# Patient Record
Sex: Female | Born: 1975 | Race: Black or African American | Hispanic: No | Marital: Married | Smoking: Never smoker
Health system: Southern US, Community
[De-identification: ages and names within clinical notes are randomized; demographics above are authoritative.]

## PROBLEM LIST (undated history)

## (undated) ENCOUNTER — Inpatient Hospital Stay (HOSPITAL_COMMUNITY): Payer: Self-pay

## (undated) DIAGNOSIS — B009 Herpesviral infection, unspecified: Secondary | ICD-10-CM

## (undated) DIAGNOSIS — R87610 Atypical squamous cells of undetermined significance on cytologic smear of cervix (ASC-US): Principal | ICD-10-CM

## (undated) HISTORY — PX: WISDOM TOOTH EXTRACTION: SHX21

## (undated) HISTORY — DX: Atypical squamous cells of undetermined significance on cytologic smear of cervix (ASC-US): R87.610

---

## 2006-01-27 ENCOUNTER — Emergency Department (HOSPITAL_COMMUNITY): Admission: EM | Admit: 2006-01-27 | Discharge: 2006-01-27 | Payer: Self-pay | Admitting: Emergency Medicine

## 2012-08-16 ENCOUNTER — Encounter: Payer: Self-pay | Admitting: Obstetrics & Gynecology

## 2012-08-16 ENCOUNTER — Ambulatory Visit (INDEPENDENT_AMBULATORY_CARE_PROVIDER_SITE_OTHER): Payer: BC Managed Care – PPO | Admitting: Obstetrics & Gynecology

## 2012-08-16 VITALS — BP 126/70 | Ht 64.0 in | Wt 189.0 lb

## 2012-08-16 DIAGNOSIS — Z3200 Encounter for pregnancy test, result unknown: Secondary | ICD-10-CM

## 2012-08-16 DIAGNOSIS — Z3201 Encounter for pregnancy test, result positive: Secondary | ICD-10-CM

## 2012-08-16 LAB — POCT URINE PREGNANCY: Preg Test, Ur: POSITIVE

## 2012-08-18 ENCOUNTER — Other Ambulatory Visit: Payer: Self-pay | Admitting: Obstetrics & Gynecology

## 2012-08-18 DIAGNOSIS — O3680X0 Pregnancy with inconclusive fetal viability, not applicable or unspecified: Secondary | ICD-10-CM

## 2012-08-23 ENCOUNTER — Ambulatory Visit (INDEPENDENT_AMBULATORY_CARE_PROVIDER_SITE_OTHER): Payer: BC Managed Care – PPO

## 2012-08-23 ENCOUNTER — Telehealth: Payer: Self-pay | Admitting: *Deleted

## 2012-08-23 DIAGNOSIS — O3680X0 Pregnancy with inconclusive fetal viability, not applicable or unspecified: Secondary | ICD-10-CM

## 2012-08-23 MED ORDER — ONDANSETRON HCL 8 MG PO TABS: 8.0000 mg | ORAL_TABLET | Freq: Two times a day (BID) | ORAL | Status: DC | PRN

## 2012-08-23 NOTE — Telephone Encounter (Signed)
Pt here today for ultrasound, requesting medication for N/V. Can you e-scribe medicaiton?

## 2012-08-23 NOTE — Telephone Encounter (Signed)
Pt complains of nausea will rx zofran 8 mg 1 every 12 hours prn #20 with 3 refills at [facility]

## 2012-08-31 ENCOUNTER — Encounter: Payer: Self-pay | Admitting: Women's Health

## 2012-08-31 ENCOUNTER — Ambulatory Visit (INDEPENDENT_AMBULATORY_CARE_PROVIDER_SITE_OTHER): Payer: BC Managed Care – PPO | Admitting: Women's Health

## 2012-08-31 VITALS — BP 110/66 | Wt 189.6 lb

## 2012-08-31 DIAGNOSIS — O0991 Supervision of high risk pregnancy, unspecified, first trimester: Secondary | ICD-10-CM

## 2012-08-31 DIAGNOSIS — Z1389 Encounter for screening for other disorder: Secondary | ICD-10-CM

## 2012-08-31 DIAGNOSIS — Z331 Pregnant state, incidental: Secondary | ICD-10-CM

## 2012-08-31 DIAGNOSIS — O99019 Anemia complicating pregnancy, unspecified trimester: Secondary | ICD-10-CM

## 2012-08-31 DIAGNOSIS — O219 Vomiting of pregnancy, unspecified: Secondary | ICD-10-CM

## 2012-08-31 DIAGNOSIS — O09521 Supervision of elderly multigravida, first trimester: Secondary | ICD-10-CM

## 2012-08-31 DIAGNOSIS — O09899 Supervision of other high risk pregnancies, unspecified trimester: Secondary | ICD-10-CM

## 2012-08-31 DIAGNOSIS — O09219 Supervision of pregnancy with history of pre-term labor, unspecified trimester: Secondary | ICD-10-CM

## 2012-08-31 DIAGNOSIS — O09529 Supervision of elderly multigravida, unspecified trimester: Secondary | ICD-10-CM | POA: Insufficient documentation

## 2012-08-31 LAB — RPR

## 2012-08-31 LAB — CBC
HCT: 34.8 % — ABNORMAL LOW (ref 36.0–46.0)
Hemoglobin: 11.2 g/dL — ABNORMAL LOW (ref 12.0–15.0)
MCH: 27.1 pg (ref 26.0–34.0)
MCHC: 32.2 g/dL (ref 30.0–36.0)
MCV: 84.3 fL (ref 78.0–100.0)
Platelets: 378 10*3/uL (ref 150–400)
RBC: 4.13 MIL/uL (ref 3.87–5.11)
RDW: 14 % (ref 11.5–15.5)
WBC: 6.9 10*3/uL (ref 4.0–10.5)

## 2012-08-31 LAB — POCT URINALYSIS DIPSTICK
Glucose, UA: NEGATIVE
Ketones, UA: NEGATIVE
Nitrite, UA: NEGATIVE
Protein, UA: NEGATIVE

## 2012-08-31 MED ORDER — DOXYLAMINE-PYRIDOXINE 10-10 MG PO TBEC: 10.0000 mg | DELAYED_RELEASE_TABLET | ORAL | Status: DC

## 2012-08-31 NOTE — Progress Notes (Signed)
  Subjective:    Theresa Farrell is a 37 y.o. [id number] African American female at [redacted]w[redacted]d by 9 wk u/s, being seen today for her first obstetrical visit.  Her obstetrical history is significant for advanced maternal age and term svd x 2, w/ largest baby weight 9+lbs w/o complications.  Pregnancy history fully reviewed. FOB is 37yo.   Patient reports feeling bloated, zofran helping some but still nauseated, constipation. Denies vb, cramping, urinary frequency, hesitancy, urgency, or dysuria.   Filed Vitals:   08/31/12 0959  BP: 110/66  Weight: 189 lb 9.6 oz (86.002 kg)    HISTORY: OB History   Grav Para Term Preterm Abortions TAB SAB Ect Mult Living   3 2 1 1      2      # Outc Date GA Lbr Len/2nd Wgt Sex Del Anes PTL Lv   1 PRE 2003 [redacted]w[redacted]d  9lb6oz(4.252kg) F SVD   Yes   2 TRM 2004 [redacted]w[redacted]d  8lb(3.629kg) M SVD   Yes   3 CUR              Past Medical History  Diagnosis Date  . Medical history non-contributory    Past Surgical History  Procedure Laterality Date  . No past surgeries     Family History  Problem Relation Age of Onset  . Cancer Mother 31    breast  . Hypertension Mother      System:     Skin: normal coloration and turgor, no rashes    Neurologic: oriented, normal mood   Extremities: normal strength, tone, and muscle mass   HEENT PERRLA   Mouth/Teeth mucous membranes moist   Cardiovascular: regular rate and rhythm   Respiratory:  appears well, vitals normal, no respiratory distress, acyanotic, normal RR   Abdomen: soft, non-tender    Thin prep pap smear not obtained, pt had normal pap in 02/2012   FHR 150 via informal transabdominal u/s   Assessment:    Pregnancy: [id number] Patient Active Problem List   Diagnosis Date Noted  . Supervision of high-risk pregnancy of elderly multigravida (>= 79 years old at time of delivery) 08/31/2012    Priority: High      [redacted]w[redacted]d [id number] New OB visit AMA  N/V of pregnancy Constipation   Plan:     Initial labs  drawn Continue prenatal vitamins Problem list reviewed and updated Obtain pap results from 02/2012 Stop Zofran, Rx Diclegis #100 w/ 4 RF, 2 samples given Reviewed n/v and constipation relief measures and warning s/s to report Reviewed recommended weight gain based on pre-gravid BMI Encouraged well-balanced diet Genetic Screening discussed Integrated Screen: requested Cystic fibrosis screening discussed requested Ultrasound discussed; fetal survey: requested Follow up in 2 weeks for 1st NT/IT and visit  Marge Duncans 08/31/2012 10:26 AM

## 2012-08-31 NOTE — Patient Instructions (Signed)
Nausea & Vomiting  Have saltine crackers or pretzels by your bed and eat a few bites before you raise your head out of bed in the morning  Eat small frequent meals throughout the day instead of large meals  Drink plenty of fluids throughout the day to stay hydrated, just don't drink a lot of fluids with your meals.  This can make your stomach fill up faster making you feel sick  Do not brush your teeth right after you eat  Products with real ginger are good for nausea, like ginger ale and ginger hard candy Make sure it says made with real ginger!  Sucking on sour candy like lemon heads is also good for nausea  If your prenatal vitamins make you nauseated, take them at night so you will sleep through the nausea  If you feel like you need medicine for the nausea & vomiting please let us know  If you are unable to keep any fluids or food down please let us know   Constipation  Drink plenty of fluid, preferably water, throughout the day  Eat foods high in fiber such as fruits, vegetables, and grains  Exercise, such as walking, is a good way to keep your bowels regular  Drink warm fluids, especially warm prune juice, or decaf coffee  Eat a 1/2 cup of real oatmeal (not instant), 1/2 cup applesauce, and 1/2-1 cup warm prune juice every day  If needed, you may take Colace (docusate sodium) stool softener once or twice a day to help keep the stool soft. If you are pregnant, wait until you are out of your first trimester (12-14 weeks of pregnancy)  If you still are having problems with constipation, you may take Miralax once daily as needed to help keep your bowels regular.  If you are pregnant, wait until you are out of your first trimester (12-14 weeks of pregnancy)    Pregnancy - First Trimester During sexual intercourse, millions of sperm go into the vagina. Only 1 sperm will penetrate and fertilize the female egg while it is in the Fallopian tube. One week later, the fertilized egg  implants into the wall of the uterus. An embryo begins to develop into a baby. At 6 to 8 weeks, the eyes and face are formed and the heartbeat can be seen on ultrasound. At the end of 12 weeks (first trimester), all the baby's organs are formed. Now that you are pregnant, you will want to do everything you can to have a healthy baby. Two of the most important things are to get good prenatal care and follow your caregiver's instructions. Prenatal care is all the medical care you receive before the baby's birth. It is given to prevent, find, and treat problems during the pregnancy and childbirth. PRENATAL EXAMS  During prenatal visits, your weight, blood pressure, and urine are checked. This is done to make sure you are healthy and progressing normally during the pregnancy.  A pregnant woman should gain 25 to 35 pounds during the pregnancy. However, if you are overweight or underweight, your caregiver will advise you regarding your weight.  Your caregiver will ask and answer questions for you.  Blood work, cervical cultures, other necessary tests, and a Pap test are done during your prenatal exams. These tests are done to check on your health and the probable health of your baby. Tests are strongly recommended and done for HIV with your permission. This is the virus that causes AIDS. These tests are done because medicines   can be given to help prevent your baby from being born with this infection should you have been infected without knowing it. Blood work is also used to find out your blood type, previous infections, and follow your blood levels (hemoglobin).  Low hemoglobin (anemia) is common during pregnancy. Iron and vitamins are given to help prevent this. Later in the pregnancy, blood tests for diabetes will be done along with any other tests if any problems develop.  You may need other tests to make sure you and the baby are doing well. CHANGES DURING THE FIRST TRIMESTER  Your body goes through  many changes during pregnancy. They vary from person to person. Talk to your caregiver about changes you notice and are concerned about. Changes can include:  Your menstrual period stops.  The egg and sperm carry the genes that determine what you look like. Genes from you and your partner are forming a baby. The female genes determine whether the baby is a boy or a girl.  Your body increases in girth and you may feel bloated.  Feeling sick to your stomach (nauseous) and throwing up (vomiting). If the vomiting is uncontrollable, call your caregiver.  Your breasts will begin to enlarge and become tender.  Your nipples may stick out more and become darker.  The need to urinate more. Painful urination may mean you have a bladder infection.  Tiring easily.  Loss of appetite.  Cravings for certain kinds of food.  At first, you may gain or lose a couple of pounds.  You may have changes in your emotions from day to day (excited to be pregnant or concerned something may go wrong with the pregnancy and baby).  You may have more vivid and strange dreams. HOME CARE INSTRUCTIONS   It is very important to avoid all smoking, alcohol and non-prescribed drugs during your pregnancy. These affect the formation and growth of the baby. Avoid chemicals while pregnant to ensure the delivery of a healthy infant.  Start your prenatal visits by the 12th week of pregnancy. They are usually scheduled monthly at first, then more often in the last 2 months before delivery. Keep your caregiver's appointments. Follow your caregiver's instructions regarding medicine use, blood and lab tests, exercise, and diet.  During pregnancy, you are providing food for you and your baby. Eat regular, well-balanced meals. Choose foods such as meat, fish, milk and other low fat dairy products, vegetables, fruits, and whole-grain breads and cereals. Your caregiver will tell you of the ideal weight gain.  You can help morning  sickness by keeping soda crackers at the bedside. Eat a couple before arising in the morning. You may want to use the crackers without salt on them.  Eating 4 to 5 small meals rather than 3 large meals a day also may help the nausea and vomiting.  Drinking liquids between meals instead of during meals also seems to help nausea and vomiting.  A physical sexual relationship may be continued throughout pregnancy if there are no other problems. Problems may be early (premature) leaking of amniotic fluid from the membranes, vaginal bleeding, or belly (abdominal) pain.  Exercise regularly if there are no restrictions. Check with your caregiver or physical therapist if you are unsure of the safety of some of your exercises. Greater weight gain will occur in the last 2 trimesters of pregnancy. Exercising will help:  Control your weight.  Keep you in shape.  Prepare you for labor and delivery.  Help you lose your pregnancy   weight after you deliver your baby.  Wear a good support or jogging bra for breast tenderness during pregnancy. This may help if worn during sleep too.  Ask when prenatal classes are available. Begin classes when they are offered.  Do not use hot tubs, steam rooms, or saunas.  Wear your seat belt when driving. This protects you and your baby if you are in an accident.  Avoid raw meat, uncooked cheese, cat litter boxes, and soil used by cats throughout the pregnancy. These carry germs that can cause birth defects in the baby.  The first trimester is a good time to visit your dentist for your dental health. Getting your teeth cleaned is okay. Use a softer toothbrush and brush gently during pregnancy.  Ask for help if you have financial, counseling, or nutritional needs during pregnancy. Your caregiver will be able to offer counseling for these needs as well as refer you for other special needs.  Do not take any medicines or herbs unless told by your caregiver.  Inform your  caregiver if there is any mental or physical domestic violence.  Make a list of emergency phone numbers of family, friends, hospital, and police and fire departments.  Write down your questions. Take them to your prenatal visit.  Do not douche.  Do not cross your legs.  If you have to stand for long periods of time, rotate you feet or take small steps in a circle.  You may have more vaginal secretions that may require a sanitary pad. Do not use tampons or scented sanitary pads. MEDICINES AND DRUG USE IN PREGNANCY  Take prenatal vitamins as directed. The vitamin should contain 1 milligram of folic acid. Keep all vitamins out of reach of children. Only a couple vitamins or tablets containing iron may be fatal to a baby or young child when ingested.  Avoid use of all medicines, including herbs, over-the-counter medicines, not prescribed or suggested by your caregiver. Only take over-the-counter or prescription medicines for pain, discomfort, or fever as directed by your caregiver. Do not use aspirin, ibuprofen, or naproxen unless directed by your caregiver.  Let your caregiver also know about herbs you may be using.  Alcohol is related to a number of birth defects. This includes fetal alcohol syndrome. All alcohol, in any form, should be avoided completely. Smoking will cause low birth rate and premature babies.  Street or illegal drugs are very harmful to the baby. They are absolutely forbidden. A baby born to an addicted mother will be addicted at birth. The baby will go through the same withdrawal an adult does.  Let your caregiver know about any medicines that you have to take and for what reason you take them. SEEK MEDICAL CARE IF:  You have any concerns or worries during your pregnancy. It is better to call with your questions if you feel they cannot wait, rather than worry about them. SEEK IMMEDIATE MEDICAL CARE IF:   An unexplained oral temperature above 102 F (38.9 C) develops,  or as your caregiver suggests.  You have leaking of fluid from the vagina (birth canal). If leaking membranes are suspected, take your temperature and inform your caregiver of this when you call.  There is vaginal spotting or bleeding. Notify your caregiver of the amount and how many pads are used.  You develop a bad smelling vaginal discharge with a change in the color.  You continue to feel sick to your stomach (nauseated) and have no relief from remedies suggested. You   vomit blood or coffee ground-like materials.  You lose more than 2 pounds of weight in 1 week.  You gain more than 2 pounds of weight in 1 week and you notice swelling of your face, hands, feet, or legs.  You gain 5 pounds or more in 1 week (even if you do not have swelling of your hands, face, legs, or feet).  You get exposed to German measles and have never had them.  You are exposed to fifth disease or chickenpox.  You develop belly (abdominal) pain. Round ligament discomfort is a common non-cancerous (benign) cause of abdominal pain in pregnancy. Your caregiver still must evaluate this.  You develop headache, fever, diarrhea, pain with urination, or shortness of breath.  You fall or are in a car accident or have any kind of trauma.  There is mental or physical violence in your home. Document Released: 02/25/2001 Document Revised: 11/26/2011 Document Reviewed: 08/29/2008 [employer] Patient Information 2014 [employer].  

## 2012-08-31 NOTE — Progress Notes (Signed)
Pt is bloated. Feels like belly is more swollen than it should be. Cramping in left side. New OB packet given. Consents signed. Pt to sign a release for pap smear. Had a pap smear in December 2013.

## 2012-09-01 LAB — URINALYSIS
Bilirubin Urine: NEGATIVE
Glucose, UA: NEGATIVE mg/dL
Hgb urine dipstick: NEGATIVE
Ketones, ur: NEGATIVE mg/dL
Leukocytes, UA: NEGATIVE
Nitrite: NEGATIVE
Protein, ur: NEGATIVE mg/dL
Specific Gravity, Urine: 1.028 (ref 1.005–1.030)
Urobilinogen, UA: 1 mg/dL (ref 0.0–1.0)
pH: 7 (ref 5.0–8.0)

## 2012-09-01 LAB — DRUG SCREEN, URINE, NO CONFIRMATION
Amphetamine Screen, Ur: NEGATIVE
Barbiturate Quant, Ur: NEGATIVE
Benzodiazepines.: NEGATIVE
Cocaine Metabolites: NEGATIVE
Creatinine,U: 197.4 mg/dL
Marijuana Metabolite: NEGATIVE
Methadone: NEGATIVE
Opiate Screen, Urine: NEGATIVE
Phencyclidine (PCP): NEGATIVE
Propoxyphene: NEGATIVE

## 2012-09-01 LAB — ABO AND RH: Rh Type: POSITIVE

## 2012-09-01 LAB — SICKLE CELL SCREEN: Sickle Cell Screen: NEGATIVE

## 2012-09-01 LAB — GC/CHLAMYDIA PROBE AMP
CT Probe RNA: NEGATIVE
GC Probe RNA: NEGATIVE

## 2012-09-01 LAB — OXYCODONE SCREEN, UA, RFLX CONFIRM: Oxycodone Screen, Ur: NEGATIVE ng/mL

## 2012-09-01 LAB — HEPATITIS B SURFACE ANTIGEN: Hepatitis B Surface Ag: NEGATIVE

## 2012-09-01 LAB — HIV ANTIBODY (ROUTINE TESTING W REFLEX): HIV: NONREACTIVE

## 2012-09-01 LAB — ANTIBODY SCREEN: Antibody Screen: NEGATIVE

## 2012-09-02 ENCOUNTER — Other Ambulatory Visit: Payer: Self-pay | Admitting: Obstetrics & Gynecology

## 2012-09-02 DIAGNOSIS — Z36 Encounter for antenatal screening of mother: Secondary | ICD-10-CM

## 2012-09-02 LAB — URINE CULTURE
Colony Count: NO GROWTH
Organism ID, Bacteria: NO GROWTH

## 2012-09-02 LAB — CYSTIC FIBROSIS DIAGNOSTIC STUDY

## 2012-09-02 LAB — VARICELLA ZOSTER ANTIBODY, IGG: Varicella IgG: 437.1 Index — ABNORMAL HIGH (ref <135.00)

## 2012-09-02 LAB — RUBELLA SCREEN: Rubella: 3.61 Index — ABNORMAL HIGH (ref <0.90)

## 2012-09-04 ENCOUNTER — Encounter: Payer: Self-pay | Admitting: Women's Health

## 2012-09-14 ENCOUNTER — Ambulatory Visit (INDEPENDENT_AMBULATORY_CARE_PROVIDER_SITE_OTHER): Payer: BC Managed Care – PPO

## 2012-09-14 ENCOUNTER — Other Ambulatory Visit: Payer: Self-pay | Admitting: Obstetrics & Gynecology

## 2012-09-14 ENCOUNTER — Ambulatory Visit (INDEPENDENT_AMBULATORY_CARE_PROVIDER_SITE_OTHER): Payer: BC Managed Care – PPO | Admitting: Advanced Practice Midwife

## 2012-09-14 VITALS — BP 104/70 | Wt 189.0 lb

## 2012-09-14 DIAGNOSIS — O99019 Anemia complicating pregnancy, unspecified trimester: Secondary | ICD-10-CM

## 2012-09-14 DIAGNOSIS — D219 Benign neoplasm of connective and other soft tissue, unspecified: Secondary | ICD-10-CM | POA: Insufficient documentation

## 2012-09-14 DIAGNOSIS — Z36 Encounter for antenatal screening of mother: Secondary | ICD-10-CM

## 2012-09-14 DIAGNOSIS — Z1389 Encounter for screening for other disorder: Secondary | ICD-10-CM

## 2012-09-14 DIAGNOSIS — Z331 Pregnant state, incidental: Secondary | ICD-10-CM

## 2012-09-14 LAB — POCT URINALYSIS DIPSTICK
Blood, UA: 1
Glucose, UA: NEGATIVE
Ketones, UA: NEGATIVE
Leukocytes, UA: NEGATIVE
Nitrite, UA: NEGATIVE
Protein, UA: NEGATIVE

## 2012-09-14 NOTE — Progress Notes (Signed)
C/o bilateral breast soreness. Advice given. Had NT/IT today.    No c/o at this time.  Routine questions about pregnancy answered.  F/U in 4 weeks for 2nd IT/LROB.

## 2012-09-14 NOTE — Progress Notes (Signed)
U/S(12+2wks)-active fetus, meas c/w dates, fluid wnl, post gr 0 plac, cx long and closed, ant. Fibroid noted = 2.0cm, bilateral adnexa WNL, NB present, NT-1.68mm

## 2012-09-22 LAB — MATERNAL SCREEN, INTEGRATED #1

## 2012-10-12 ENCOUNTER — Ambulatory Visit (INDEPENDENT_AMBULATORY_CARE_PROVIDER_SITE_OTHER): Payer: BC Managed Care – PPO | Admitting: Women's Health

## 2012-10-12 ENCOUNTER — Other Ambulatory Visit: Payer: Self-pay | Admitting: Women's Health

## 2012-10-12 VITALS — BP 122/60 | Wt 192.8 lb

## 2012-10-12 DIAGNOSIS — Z3482 Encounter for supervision of other normal pregnancy, second trimester: Secondary | ICD-10-CM

## 2012-10-12 DIAGNOSIS — O99019 Anemia complicating pregnancy, unspecified trimester: Secondary | ICD-10-CM

## 2012-10-12 DIAGNOSIS — Z1389 Encounter for screening for other disorder: Secondary | ICD-10-CM

## 2012-10-12 DIAGNOSIS — Z331 Pregnant state, incidental: Secondary | ICD-10-CM

## 2012-10-12 LAB — POCT URINALYSIS DIPSTICK
Blood, UA: 1
Glucose, UA: NEGATIVE
Ketones, UA: NEGATIVE
Nitrite, UA: NEGATIVE
Protein, UA: NEGATIVE

## 2012-10-12 MED ORDER — PRENATAL PLUS 27-1 MG PO TABS: 1.0000 | ORAL_TABLET | Freq: Every day | ORAL | Status: DC

## 2012-10-12 NOTE — Progress Notes (Signed)
C/o abdominal pain and constipation. Needs refill on MV and PNV. 2nd IT today.

## 2012-10-12 NOTE — Progress Notes (Signed)
Denies uc's, lof, vb, urinary frequency, urgency, hesitancy, or dysuria.  Constipation, discussed prevention/relief measures.  Reviewed warning s/s to report.  All questions answered. 2nd IT today. F/U in 4wks for anatomy u/s and visit.

## 2012-10-12 NOTE — Patient Instructions (Signed)
Constipation  Drink plenty of fluid, preferably water, throughout the day  Eat foods high in fiber such as fruits, vegetables, and grains  Exercise, such as walking, is a good way to keep your bowels regular  Drink warm fluids, especially warm prune juice, or decaf coffee  Eat a 1/2 cup of real oatmeal (not instant), 1/2 cup applesauce, and 1/2-1 cup warm prune juice every day  If needed, you may take Colace (docusate sodium) stool softener once or twice a day to help keep the stool soft. If you are pregnant, wait until you are out of your first trimester (12-14 weeks of pregnancy)  If you still are having problems with constipation, you may take Miralax once daily as needed to help keep your bowels regular.  If you are pregnant, wait until you are out of your first trimester (12-14 weeks of pregnancy)   Pregnancy - Second Trimester The second trimester of pregnancy (3 to 6 months) is a period of rapid growth for you and your baby. At the end of the sixth month, your baby is about 9 inches long and weighs 1 1/2 pounds. You will begin to feel the baby move between 18 and 20 weeks of the pregnancy. This is called quickening. Weight gain is faster. A clear fluid (colostrum) may leak out of your breasts. You may feel small contractions of the womb (uterus). This is known as false labor or Braxton-Hicks contractions. This is like a practice for labor when the baby is ready to be born. Usually, the problems with morning sickness have usually passed by the end of your first trimester. Some women develop small dark blotches (called cholasma, mask of pregnancy) on their face that usually goes away after the baby is born. Exposure to the sun makes the blotches worse. Acne may also develop in some pregnant women and pregnant women who have acne, may find that it goes away. PRENATAL EXAMS  Blood work may continue to be done during prenatal exams. These tests are done to check on your health and the probable  health of your baby. Blood work is used to follow your blood levels (hemoglobin). Anemia (low hemoglobin) is common during pregnancy. Iron and vitamins are given to help prevent this. You will also be checked for diabetes between 24 and 28 weeks of the pregnancy. Some of the previous blood tests may be repeated.  The size of the uterus is measured during each visit. This is to make sure that the baby is continuing to grow properly according to the dates of the pregnancy.  Your blood pressure is checked every prenatal visit. This is to make sure you are not getting toxemia.  Your urine is checked to make sure you do not have an infection, diabetes or protein in the urine.  Your weight is checked often to make sure gains are happening at the suggested rate. This is to ensure that both you and your baby are growing normally.  Sometimes, an ultrasound is performed to confirm the proper growth and development of the baby. This is a test which bounces harmless sound waves off the baby so your caregiver can more accurately determine due dates. Sometimes, a test is done on the amniotic fluid surrounding the baby. This test is called an amniocentesis. The amniotic fluid is obtained by sticking a needle into the belly (abdomen). This is done to check the chromosomes in instances where there is a concern about possible genetic problems with the baby. It is also sometimes  done near the end of pregnancy if an early delivery is required. In this case, it is done to help make sure the baby's lungs are mature enough for the baby to live outside of the womb. CHANGES OCCURING IN THE SECOND TRIMESTER OF PREGNANCY Your body goes through many changes during pregnancy. They vary from person to person. Talk to your caregiver about changes you notice that you are concerned about.  During the second trimester, you will likely have an increase in your appetite. It is normal to have cravings for certain foods. This varies from  person to person and pregnancy to pregnancy.  Your lower abdomen will begin to bulge.  You may have to urinate more often because the uterus and baby are pressing on your bladder. It is also common to get more bladder infections during pregnancy. You can help this by drinking lots of fluids and emptying your bladder before and after intercourse.  You may begin to get stretch marks on your hips, abdomen, and breasts. These are normal changes in the body during pregnancy. There are no exercises or medicines to take that prevent this change.  You may begin to develop swollen and bulging veins (varicose veins) in your legs. Wearing support hose, elevating your feet for 15 minutes, 3 to 4 times a day and limiting salt in your diet helps lessen the problem.  Heartburn may develop as the uterus grows and pushes up against the stomach. Antacids recommended by your caregiver helps with this problem. Also, eating smaller meals 4 to 5 times a day helps.  Constipation can be treated with a stool softener or adding bulk to your diet. Drinking lots of fluids, and eating vegetables, fruits, and whole grains are helpful.  Exercising is also helpful. If you have been very active up until your pregnancy, most of these activities can be continued during your pregnancy. If you have been less active, it is helpful to start an exercise program such as walking.  Hemorrhoids may develop at the end of the second trimester. Warm sitz baths and hemorrhoid cream recommended by your caregiver helps hemorrhoid problems.  Backaches may develop during this time of your pregnancy. Avoid heavy lifting, wear low heal shoes, and practice good posture to help with backache problems.  Some pregnant women develop tingling and numbness of their hand and fingers because of swelling and tightening of ligaments in the wrist (carpel tunnel syndrome). This goes away after the baby is born.  As your breasts enlarge, you may have to get a  bigger bra. Get a comfortable, cotton, support bra. Do not get a nursing bra until the last month of the pregnancy if you will be nursing the baby.  You may get a dark line from your belly button to the pubic area called the linea nigra.  You may develop rosy cheeks because of increase blood flow to the face.  You may develop spider looking lines of the face, neck, arms, and chest. These go away after the baby is born. HOME CARE INSTRUCTIONS   It is extremely important to avoid all smoking, herbs, alcohol, and unprescribed drugs during your pregnancy. These chemicals affect the formation and growth of the baby. Avoid these chemicals throughout the pregnancy to ensure the delivery of a healthy infant.  Most of your home care instructions are the same as suggested for the first trimester of your pregnancy. Keep your caregiver's appointments. Follow your caregiver's instructions regarding medicine use, exercise, and diet.  During pregnancy, you  are providing food for you and your baby. Continue to eat regular, well-balanced meals. Choose foods such as meat, fish, milk and other low fat dairy products, vegetables, fruits, and whole-grain breads and cereals. Your caregiver will tell you of the ideal weight gain.  A physical sexual relationship may be continued up until near the end of pregnancy if there are no other problems. Problems could include early (premature) leaking of amniotic fluid from the membranes, vaginal bleeding, abdominal pain, or other medical or pregnancy problems.  Exercise regularly if there are no restrictions. Check with your caregiver if you are unsure of the safety of some of your exercises. The greatest weight gain will occur in the last 2 trimesters of pregnancy. Exercise will help you:  Control your weight.  Get you in shape for labor and delivery.  Lose weight after you have the baby.  Wear a good support or jogging bra for breast tenderness during pregnancy. This may  help if worn during sleep. Pads or tissues may be used in the bra if you are leaking colostrum.  Do not use hot tubs, steam rooms or saunas throughout the pregnancy.  Wear your seat belt at all times when driving. This protects you and your baby if you are in an accident.  Avoid raw meat, uncooked cheese, cat litter boxes, and soil used by cats. These carry germs that can cause birth defects in the baby.  The second trimester is also a good time to visit your dentist for your dental health if this has not been done yet. Getting your teeth cleaned is okay. Use a soft toothbrush. Brush gently during pregnancy.  It is easier to leak urine during pregnancy. Tightening up and strengthening the pelvic muscles will help with this problem. Practice stopping your urination while you are going to the bathroom. These are the same muscles you need to strengthen. It is also the muscles you would use as if you were trying to stop from passing gas. You can practice tightening these muscles up 10 times a set and repeating this about 3 times per day. Once you know what muscles to tighten up, do not perform these exercises during urination. It is more likely to contribute to an infection by backing up the urine.  Ask for help if you have financial, counseling, or nutritional needs during pregnancy. Your caregiver will be able to offer counseling for these needs as well as refer you for other special needs.  Your skin may become oily. If so, wash your face with mild soap, use non-greasy moisturizer and oil or cream based makeup. MEDICINES AND DRUG USE IN PREGNANCY  Take prenatal vitamins as directed. The vitamin should contain 1 milligram of folic acid. Keep all vitamins out of reach of children. Only a couple vitamins or tablets containing iron may be fatal to a baby or young child when ingested.  Avoid use of all medicines, including herbs, over-the-counter medicines, not prescribed or suggested by your caregiver.  Only take over-the-counter or prescription medicines for pain, discomfort, or fever as directed by your caregiver. Do not use aspirin.  Let your caregiver also know about herbs you may be using.  Alcohol is related to a number of birth defects. This includes fetal alcohol syndrome. All alcohol, in any form, should be avoided completely. Smoking will cause low birth rate and premature babies.  Street or illegal drugs are very harmful to the baby. They are absolutely forbidden. A baby born to an addicted mother will  be addicted at birth. The baby will go through the same withdrawal an adult does. SEEK MEDICAL CARE IF:  You have any concerns or worries during your pregnancy. It is better to call with your questions if you feel they cannot wait, rather than worry about them. SEEK IMMEDIATE MEDICAL CARE IF:   An unexplained oral temperature above 102 F (38.9 C) develops, or as your caregiver suggests.  You have leaking of fluid from the vagina (birth canal). If leaking membranes are suspected, take your temperature and tell your caregiver of this when you call.  There is vaginal spotting, bleeding, or passing clots. Tell your caregiver of the amount and how many pads are used. Light spotting in pregnancy is common, especially following intercourse.  You develop a bad smelling vaginal discharge with a change in the color from clear to white.  You continue to feel sick to your stomach (nauseated) and have no relief from remedies suggested. You vomit blood or coffee ground-like materials.  You lose more than 2 pounds of weight or gain more than 2 pounds of weight over 1 week, or as suggested by your caregiver.  You notice swelling of your face, hands, feet, or legs.  You get exposed to [place] measles and have never had them.  You are exposed to fifth disease or chickenpox.  You develop belly (abdominal) pain. Round ligament discomfort is a common non-cancerous (benign) cause of abdominal pain  in pregnancy. Your caregiver still must evaluate you.  You develop a bad headache that does not go away.  You develop fever, diarrhea, pain with urination, or shortness of breath.  You develop visual problems, blurry, or double vision.  You fall or are in a car accident or any kind of trauma.  There is mental or physical violence at home. Document Released: 02/25/2001 Document Revised: 11/26/2011 Document Reviewed: 08/30/2008 [facility] Patient Information 2014 [place], [place].

## 2012-10-15 LAB — MATERNAL SCREEN, INTEGRATED #2
AFP MoM: 0.96
AFP, Serum: 35 ng/mL
Age risk Down Syndrome: 1:230 {titer}
Calculated Gestational Age: 16.3
Crown Rump Length: 60.1 mm
Estriol Mom: 1.15
Estriol, Free: 0.97 ng/mL
Inhibin A Dimeric: 121 pg/mL
Inhibin A MoM: 0.77
MSS Down Syndrome: <1:5000 {titer}
MSS Trisomy 18 Risk: <1:5000 {titer}
NT MoM: 1.05
Nuchal Translucency: 1.45 mm
Number of fetuses: 1
PAPP-A MoM: 0.85
PAPP-A: 780 ng/mL
Rish for ONTD: <1:5000 {titer}
hCG MoM: 0.53
hCG, Serum: 18.7 IU/mL

## 2012-11-09 ENCOUNTER — Encounter: Payer: Self-pay | Admitting: Women's Health

## 2012-11-09 ENCOUNTER — Ambulatory Visit (INDEPENDENT_AMBULATORY_CARE_PROVIDER_SITE_OTHER): Payer: BC Managed Care – PPO | Admitting: Women's Health

## 2012-11-09 ENCOUNTER — Other Ambulatory Visit: Payer: Self-pay | Admitting: Women's Health

## 2012-11-09 ENCOUNTER — Ambulatory Visit (INDEPENDENT_AMBULATORY_CARE_PROVIDER_SITE_OTHER): Payer: BC Managed Care – PPO

## 2012-11-09 VITALS — BP 110/60 | Wt 194.8 lb

## 2012-11-09 DIAGNOSIS — Z331 Pregnant state, incidental: Secondary | ICD-10-CM

## 2012-11-09 DIAGNOSIS — Z1389 Encounter for screening for other disorder: Secondary | ICD-10-CM

## 2012-11-09 DIAGNOSIS — R350 Frequency of micturition: Secondary | ICD-10-CM

## 2012-11-09 DIAGNOSIS — O09522 Supervision of elderly multigravida, second trimester: Secondary | ICD-10-CM

## 2012-11-09 DIAGNOSIS — D219 Benign neoplasm of connective and other soft tissue, unspecified: Secondary | ICD-10-CM

## 2012-11-09 DIAGNOSIS — Z3482 Encounter for supervision of other normal pregnancy, second trimester: Secondary | ICD-10-CM

## 2012-11-09 DIAGNOSIS — O09529 Supervision of elderly multigravida, unspecified trimester: Secondary | ICD-10-CM

## 2012-11-09 DIAGNOSIS — O99019 Anemia complicating pregnancy, unspecified trimester: Secondary | ICD-10-CM

## 2012-11-09 LAB — POCT URINALYSIS DIPSTICK
Glucose, UA: NEGATIVE
Ketones, UA: NEGATIVE
Nitrite, UA: NEGATIVE
Protein, UA: NEGATIVE

## 2012-11-09 NOTE — Progress Notes (Signed)
Reports good fm. Denies uc's, lof, vb, urgency, hesitancy, or dysuria. Some increased urinary frequency, will send urine cx. Reviewed ptl s/s.  All questions answered. F/U in 4wks for visit.

## 2012-11-09 NOTE — Patient Instructions (Addendum)
Pregnancy - Second Trimester The second trimester of pregnancy (3 to 6 months) is a period of rapid growth for you and your baby. At the end of the sixth month, your baby is about 9 inches long and weighs 1 1/2 pounds. You will begin to feel the baby move between 18 and 20 weeks of the pregnancy. This is called quickening. Weight gain is faster. A clear fluid (colostrum) may leak out of your breasts. You may feel small contractions of the womb (uterus). This is known as false labor or Braxton-Hicks contractions. This is like a practice for labor when the baby is ready to be born. Usually, the problems with morning sickness have usually passed by the end of your first trimester. Some women develop small dark blotches (called cholasma, mask of pregnancy) on their face that usually goes away after the baby is born. Exposure to the sun makes the blotches worse. Acne may also develop in some pregnant women and pregnant women who have acne, may find that it goes away. PRENATAL EXAMS  Blood work may continue to be done during prenatal exams. These tests are done to check on your health and the probable health of your baby. Blood work is used to follow your blood levels (hemoglobin). Anemia (low hemoglobin) is common during pregnancy. Iron and vitamins are given to help prevent this. You will also be checked for diabetes between 24 and 28 weeks of the pregnancy. Some of the previous blood tests may be repeated.  The size of the uterus is measured during each visit. This is to make sure that the baby is continuing to grow properly according to the dates of the pregnancy.  Your blood pressure is checked every prenatal visit. This is to make sure you are not getting toxemia.  Your urine is checked to make sure you do not have an infection, diabetes or protein in the urine.  Your weight is checked often to make sure gains are happening at the suggested rate. This is to ensure that both you and your baby are  growing normally.  Sometimes, an ultrasound is performed to confirm the proper growth and development of the baby. This is a test which bounces harmless sound waves off the baby so your caregiver can more accurately determine due dates. Sometimes, a test is done on the amniotic fluid surrounding the baby. This test is called an amniocentesis. The amniotic fluid is obtained by sticking a needle into the belly (abdomen). This is done to check the chromosomes in instances where there is a concern about possible genetic problems with the baby. It is also sometimes done near the end of pregnancy if an early delivery is required. In this case, it is done to help make sure the baby's lungs are mature enough for the baby to live outside of the womb. CHANGES OCCURING IN THE SECOND TRIMESTER OF PREGNANCY Your body goes through many changes during pregnancy. They vary from person to person. Talk to your caregiver about changes you notice that you are concerned about.  During the second trimester, you will likely have an increase in your appetite. It is normal to have cravings for certain foods. This varies from person to person and pregnancy to pregnancy.  Your lower abdomen will begin to bulge.  You may have to urinate more often because the uterus and baby are pressing on your bladder. It is also common to get more bladder infections during pregnancy. You can help this by drinking lots of fluids   and emptying your bladder before and after intercourse.  You may begin to get stretch marks on your hips, abdomen, and breasts. These are normal changes in the body during pregnancy. There are no exercises or medicines to take that prevent this change.  You may begin to develop swollen and bulging veins (varicose veins) in your legs. Wearing support hose, elevating your feet for 15 minutes, 3 to 4 times a day and limiting salt in your diet helps lessen the problem.  Heartburn may develop as the uterus grows and  pushes up against the stomach. Antacids recommended by your caregiver helps with this problem. Also, eating smaller meals 4 to 5 times a day helps.  Constipation can be treated with a stool softener or adding bulk to your diet. Drinking lots of fluids, and eating vegetables, fruits, and whole grains are helpful.  Exercising is also helpful. If you have been very active up until your pregnancy, most of these activities can be continued during your pregnancy. If you have been less active, it is helpful to start an exercise program such as walking.  Hemorrhoids may develop at the end of the second trimester. Warm sitz baths and hemorrhoid cream recommended by your caregiver helps hemorrhoid problems.  Backaches may develop during this time of your pregnancy. Avoid heavy lifting, wear low heal shoes, and practice good posture to help with backache problems.  Some pregnant women develop tingling and numbness of their hand and fingers because of swelling and tightening of ligaments in the wrist (carpel tunnel syndrome). This goes away after the baby is born.  As your breasts enlarge, you may have to get a bigger bra. Get a comfortable, cotton, support bra. Do not get a nursing bra until the last month of the pregnancy if you will be nursing the baby.  You may get a dark line from your belly button to the pubic area called the linea nigra.  You may develop rosy cheeks because of increase blood flow to the face.  You may develop spider looking lines of the face, neck, arms, and chest. These go away after the baby is born. HOME CARE INSTRUCTIONS   It is extremely important to avoid all smoking, herbs, alcohol, and unprescribed drugs during your pregnancy. These chemicals affect the formation and growth of the baby. Avoid these chemicals throughout the pregnancy to ensure the delivery of a healthy infant.  Most of your home care instructions are the same as suggested for the first trimester of your  pregnancy. Keep your caregiver's appointments. Follow your caregiver's instructions regarding medicine use, exercise, and diet.  During pregnancy, you are providing food for you and your baby. Continue to eat regular, well-balanced meals. Choose foods such as meat, fish, milk and other low fat dairy products, vegetables, fruits, and whole-grain breads and cereals. Your caregiver will tell you of the ideal weight gain.  A physical sexual relationship may be continued up until near the end of pregnancy if there are no other problems. Problems could include early (premature) leaking of amniotic fluid from the membranes, vaginal bleeding, abdominal pain, or other medical or pregnancy problems.  Exercise regularly if there are no restrictions. Check with your caregiver if you are unsure of the safety of some of your exercises. The greatest weight gain will occur in the last 2 trimesters of pregnancy. Exercise will help you:  Control your weight.  Get you in shape for labor and delivery.  Lose weight after you have the baby.  Wear   a good support or jogging bra for breast tenderness during pregnancy. This may help if worn during sleep. Pads or tissues may be used in the bra if you are leaking colostrum.  Do not use hot tubs, steam rooms or saunas throughout the pregnancy.  Wear your seat belt at all times when driving. This protects you and your baby if you are in an accident.  Avoid raw meat, uncooked cheese, cat litter boxes, and soil used by cats. These carry germs that can cause birth defects in the baby.  The second trimester is also a good time to visit your dentist for your dental health if this has not been done yet. Getting your teeth cleaned is okay. Use a soft toothbrush. Brush gently during pregnancy.  It is easier to leak urine during pregnancy. Tightening up and strengthening the pelvic muscles will help with this problem. Practice stopping your urination while you are going to the  bathroom. These are the same muscles you need to strengthen. It is also the muscles you would use as if you were trying to stop from passing gas. You can practice tightening these muscles up 10 times a set and repeating this about 3 times per day. Once you know what muscles to tighten up, do not perform these exercises during urination. It is more likely to contribute to an infection by backing up the urine.  Ask for help if you have financial, counseling, or nutritional needs during pregnancy. Your caregiver will be able to offer counseling for these needs as well as refer you for other special needs.  Your skin may become oily. If so, wash your face with mild soap, use non-greasy moisturizer and oil or cream based makeup. MEDICINES AND DRUG USE IN PREGNANCY  Take prenatal vitamins as directed. The vitamin should contain 1 milligram of folic acid. Keep all vitamins out of reach of children. Only a couple vitamins or tablets containing iron may be fatal to a baby or young child when ingested.  Avoid use of all medicines, including herbs, over-the-counter medicines, not prescribed or suggested by your caregiver. Only take over-the-counter or prescription medicines for pain, discomfort, or fever as directed by your caregiver. Do not use aspirin.  Let your caregiver also know about herbs you may be using.  Alcohol is related to a number of birth defects. This includes fetal alcohol syndrome. All alcohol, in any form, should be avoided completely. Smoking will cause low birth rate and premature babies.  Street or illegal drugs are very harmful to the baby. They are absolutely forbidden. A baby born to an addicted mother will be addicted at birth. The baby will go through the same withdrawal an adult does. SEEK MEDICAL CARE IF:  You have any concerns or worries during your pregnancy. It is better to call with your questions if you feel they cannot wait, rather than worry about them. SEEK IMMEDIATE  MEDICAL CARE IF:   An unexplained oral temperature above 102 F (38.9 C) develops, or as your caregiver suggests.  You have leaking of fluid from the vagina (birth canal). If leaking membranes are suspected, take your temperature and tell your caregiver of this when you call.  There is vaginal spotting, bleeding, or passing clots. Tell your caregiver of the amount and how many pads are used. Light spotting in pregnancy is common, especially following intercourse.  You develop a bad smelling vaginal discharge with a change in the color from clear to white.  You continue to feel   sick to your stomach (nauseated) and have no relief from remedies suggested. You vomit blood or coffee ground-like materials.  You lose more than 2 pounds of weight or gain more than 2 pounds of weight over 1 week, or as suggested by your caregiver.  You notice swelling of your face, hands, feet, or legs.  You get exposed to [place] measles and have never had them.  You are exposed to fifth disease or chickenpox.  You develop belly (abdominal) pain. Round ligament discomfort is a common non-cancerous (benign) cause of abdominal pain in pregnancy. Your caregiver still must evaluate you.  You develop a bad headache that does not go away.  You develop fever, diarrhea, pain with urination, or shortness of breath.  You develop visual problems, blurry, or double vision.  You fall or are in a car accident or any kind of trauma.  There is mental or physical violence at home. Document Released: 02/25/2001 Document Revised: 11/26/2011 Document Reviewed: 08/30/2008 [employer] Patient Information 2014 [employer].  

## 2012-11-09 NOTE — Progress Notes (Signed)
U/S(20+2wks)-

## 2012-11-09 NOTE — Progress Notes (Addendum)
U/S(20+2wks)-active fetus, meas c/w dates fluid WNL, post GR  0 plac ,cx long and closed, bilateral adnexa WNL, no major abnl noted, female fetus, anterior fibroid 2.2cm noted

## 2012-11-10 ENCOUNTER — Ambulatory Visit (INDEPENDENT_AMBULATORY_CARE_PROVIDER_SITE_OTHER): Payer: BC Managed Care – PPO | Admitting: Advanced Practice Midwife

## 2012-11-10 ENCOUNTER — Encounter: Payer: Self-pay | Admitting: Advanced Practice Midwife

## 2012-11-10 ENCOUNTER — Telehealth: Payer: Self-pay | Admitting: Obstetrics and Gynecology

## 2012-11-10 VITALS — BP 108/68 | Wt 193.5 lb

## 2012-11-10 DIAGNOSIS — Z1389 Encounter for screening for other disorder: Secondary | ICD-10-CM

## 2012-11-10 DIAGNOSIS — Z331 Pregnant state, incidental: Secondary | ICD-10-CM

## 2012-11-10 DIAGNOSIS — O99019 Anemia complicating pregnancy, unspecified trimester: Secondary | ICD-10-CM

## 2012-11-10 DIAGNOSIS — O2390 Unspecified genitourinary tract infection in pregnancy, unspecified trimester: Secondary | ICD-10-CM

## 2012-11-10 DIAGNOSIS — O09529 Supervision of elderly multigravida, unspecified trimester: Secondary | ICD-10-CM

## 2012-11-10 LAB — POCT URINALYSIS DIPSTICK
Glucose, UA: NEGATIVE
Nitrite, UA: NEGATIVE

## 2012-11-10 LAB — URINE CULTURE
Colony Count: NO GROWTH
Organism ID, Bacteria: NO GROWTH

## 2012-11-10 NOTE — Telephone Encounter (Signed)
Pt states had bright red blood from vaginal area after having a BM, also c/o lower abdominal pressure. Pt states has not had any bleeding since. Per Cyril Mourning, NP pt to be worked into schedule today. Pt informed to be here at 1:30 pm to see provider.

## 2012-11-10 NOTE — Progress Notes (Signed)
Saw bright red vaginal blood after using the bathroom yesterday.  "a little pressure".  No blood since, pressure is better.   SSE:  Thin white vaginal d/c; no blood whatsoever in vagina.  Cx non friable.  Wet prep negative.  Pt reassured.

## 2012-11-16 ENCOUNTER — Other Ambulatory Visit: Payer: Self-pay | Admitting: Women's Health

## 2012-11-16 DIAGNOSIS — Z3482 Encounter for supervision of other normal pregnancy, second trimester: Secondary | ICD-10-CM

## 2012-11-16 MED ORDER — PRENATAL PLUS 27-1 MG PO TABS: 1.0000 | ORAL_TABLET | Freq: Every day | ORAL | Status: DC

## 2012-12-07 ENCOUNTER — Ambulatory Visit (INDEPENDENT_AMBULATORY_CARE_PROVIDER_SITE_OTHER): Payer: BC Managed Care – PPO | Admitting: Obstetrics & Gynecology

## 2012-12-07 ENCOUNTER — Encounter: Payer: Self-pay | Admitting: Obstetrics & Gynecology

## 2012-12-07 VITALS — BP 102/62 | Wt 196.5 lb

## 2012-12-07 DIAGNOSIS — O09899 Supervision of other high risk pregnancies, unspecified trimester: Secondary | ICD-10-CM

## 2012-12-07 DIAGNOSIS — O09529 Supervision of elderly multigravida, unspecified trimester: Secondary | ICD-10-CM

## 2012-12-07 DIAGNOSIS — O09219 Supervision of pregnancy with history of pre-term labor, unspecified trimester: Secondary | ICD-10-CM

## 2012-12-07 DIAGNOSIS — O99019 Anemia complicating pregnancy, unspecified trimester: Secondary | ICD-10-CM

## 2012-12-07 DIAGNOSIS — Z1389 Encounter for screening for other disorder: Secondary | ICD-10-CM

## 2012-12-07 DIAGNOSIS — Z331 Pregnant state, incidental: Secondary | ICD-10-CM

## 2012-12-07 LAB — POCT URINALYSIS DIPSTICK
Glucose, UA: NEGATIVE
Ketones, UA: NEGATIVE
Leukocytes, UA: NEGATIVE
Nitrite, UA: NEGATIVE
Protein, UA: NEGATIVE

## 2012-12-07 NOTE — Progress Notes (Signed)
BP weight and urine results all reviewed and noted. Patient reports good fetal movement, denies any bleeding and no rupture of membranes symptoms or regular contractions. Patient is without complaints. All questions were answered.  

## 2013-01-03 ENCOUNTER — Other Ambulatory Visit: Payer: BC Managed Care – PPO

## 2013-01-03 ENCOUNTER — Encounter: Payer: Self-pay | Admitting: Women's Health

## 2013-01-03 ENCOUNTER — Ambulatory Visit (INDEPENDENT_AMBULATORY_CARE_PROVIDER_SITE_OTHER): Payer: BC Managed Care – PPO | Admitting: Women's Health

## 2013-01-03 VITALS — BP 98/50 | Wt 193.5 lb

## 2013-01-03 DIAGNOSIS — Z1389 Encounter for screening for other disorder: Secondary | ICD-10-CM

## 2013-01-03 DIAGNOSIS — O09529 Supervision of elderly multigravida, unspecified trimester: Secondary | ICD-10-CM | POA: Insufficient documentation

## 2013-01-03 DIAGNOSIS — O09523 Supervision of elderly multigravida, third trimester: Secondary | ICD-10-CM

## 2013-01-03 DIAGNOSIS — O0993 Supervision of high risk pregnancy, unspecified, third trimester: Secondary | ICD-10-CM

## 2013-01-03 DIAGNOSIS — Z23 Encounter for immunization: Secondary | ICD-10-CM

## 2013-01-03 DIAGNOSIS — O2390 Unspecified genitourinary tract infection in pregnancy, unspecified trimester: Secondary | ICD-10-CM

## 2013-01-03 DIAGNOSIS — O4693 Antepartum hemorrhage, unspecified, third trimester: Secondary | ICD-10-CM

## 2013-01-03 DIAGNOSIS — O99019 Anemia complicating pregnancy, unspecified trimester: Secondary | ICD-10-CM

## 2013-01-03 DIAGNOSIS — N898 Other specified noninflammatory disorders of vagina: Secondary | ICD-10-CM

## 2013-01-03 DIAGNOSIS — Z331 Pregnant state, incidental: Secondary | ICD-10-CM

## 2013-01-03 DIAGNOSIS — Z3483 Encounter for supervision of other normal pregnancy, third trimester: Secondary | ICD-10-CM

## 2013-01-03 LAB — POCT URINALYSIS DIPSTICK
Glucose, UA: NEGATIVE
Leukocytes, UA: NEGATIVE
Nitrite, UA: NEGATIVE
Protein, UA: NEGATIVE

## 2013-01-03 LAB — POCT WET PREP (WET MOUNT): Clue Cells Wet Prep Whiff POC: NEGATIVE

## 2013-01-03 MED ORDER — INFLUENZA VAC SPLIT QUAD 0.5 ML IM SUSP
0.5000 mL | Freq: Once | INTRAMUSCULAR | Status: AC
  Administered 2013-01-03: 0.5 mL via INTRAMUSCULAR

## 2013-01-03 NOTE — Progress Notes (Signed)
Noticed light pink spotting on Friday and Saturday. None since then.

## 2013-01-03 NOTE — Progress Notes (Signed)
Reports good fm. Denies uc's, lof, vb, urinary frequency, urgency, hesitancy, or dysuria.  Light pink spotting Fri-Sat. Has been moving houses, doing a lot of walking. Not heavy lifting. Denies recent SI or constipation, abnormal/malodorous vulvovaginal itching/irritation. No spotting today. Spec exam small amount white creamy nonodorous d/c, cx visually closed w/o irritation. Wet prep mod wbc's. Will send gc/ch. Discussed different reasons for vb during pregnancy. PN2 today.  Reviewed ptl s/s, fkc, reasons to call/return/go to [facility].  All questions answered. F/U in 4wks for [place] bpp/afi/efw d/t ama and visit.

## 2013-01-03 NOTE — Patient Instructions (Signed)

## 2013-01-04 ENCOUNTER — Encounter: Payer: BC Managed Care – PPO | Admitting: Women's Health

## 2013-01-04 ENCOUNTER — Other Ambulatory Visit: Payer: BC Managed Care – PPO

## 2013-01-04 LAB — GLUCOSE TOLERANCE, 2 HOURS W/ 1HR
Glucose, 1 hour: 138 mg/dL (ref 70–170)
Glucose, 2 hour: 129 mg/dL (ref 70–139)
Glucose, Fasting: 76 mg/dL (ref 70–99)

## 2013-01-04 LAB — RPR

## 2013-01-04 LAB — CBC
HCT: 34.6 % — ABNORMAL LOW (ref 36.0–46.0)
Hemoglobin: 11.3 g/dL — ABNORMAL LOW (ref 12.0–15.0)
MCH: 28 pg (ref 26.0–34.0)
MCHC: 32.7 g/dL (ref 30.0–36.0)
MCV: 85.6 fL (ref 78.0–100.0)
Platelets: 340 10*3/uL (ref 150–400)
RBC: 4.04 MIL/uL (ref 3.87–5.11)
RDW: 14.9 % (ref 11.5–15.5)
WBC: 6.5 10*3/uL (ref 4.0–10.5)

## 2013-01-04 LAB — GC/CHLAMYDIA PROBE AMP
CT Probe RNA: NEGATIVE
GC Probe RNA: NEGATIVE

## 2013-01-04 LAB — HSV 2 ANTIBODY, IGG: HSV 2 Glycoprotein G Ab, IgG: 12.33 IV — ABNORMAL HIGH

## 2013-01-04 LAB — HIV ANTIBODY (ROUTINE TESTING W REFLEX): HIV: NONREACTIVE

## 2013-01-04 LAB — ANTIBODY SCREEN: Antibody Screen: NEGATIVE

## 2013-01-31 ENCOUNTER — Other Ambulatory Visit: Payer: BC Managed Care – PPO

## 2013-01-31 ENCOUNTER — Encounter: Payer: BC Managed Care – PPO | Admitting: Women's Health

## 2013-02-01 ENCOUNTER — Other Ambulatory Visit: Payer: Self-pay | Admitting: Obstetrics & Gynecology

## 2013-02-01 ENCOUNTER — Encounter: Payer: Self-pay | Admitting: Women's Health

## 2013-02-01 ENCOUNTER — Ambulatory Visit (INDEPENDENT_AMBULATORY_CARE_PROVIDER_SITE_OTHER): Payer: BC Managed Care – PPO | Admitting: Women's Health

## 2013-02-01 ENCOUNTER — Ambulatory Visit (INDEPENDENT_AMBULATORY_CARE_PROVIDER_SITE_OTHER): Payer: BC Managed Care – PPO

## 2013-02-01 VITALS — BP 120/62 | Wt 193.0 lb

## 2013-02-01 DIAGNOSIS — Z1389 Encounter for screening for other disorder: Secondary | ICD-10-CM

## 2013-02-01 DIAGNOSIS — O09523 Supervision of elderly multigravida, third trimester: Secondary | ICD-10-CM

## 2013-02-01 DIAGNOSIS — O09529 Supervision of elderly multigravida, unspecified trimester: Secondary | ICD-10-CM

## 2013-02-01 DIAGNOSIS — D219 Benign neoplasm of connective and other soft tissue, unspecified: Secondary | ICD-10-CM

## 2013-02-01 DIAGNOSIS — R7689 Other specified abnormal immunological findings in serum: Secondary | ICD-10-CM | POA: Insufficient documentation

## 2013-02-01 DIAGNOSIS — Z331 Pregnant state, incidental: Secondary | ICD-10-CM

## 2013-02-01 DIAGNOSIS — R768 Other specified abnormal immunological findings in serum: Secondary | ICD-10-CM | POA: Insufficient documentation

## 2013-02-01 DIAGNOSIS — O99019 Anemia complicating pregnancy, unspecified trimester: Secondary | ICD-10-CM

## 2013-02-01 LAB — POCT URINALYSIS DIPSTICK
Blood, UA: NEGATIVE
Glucose, UA: NEGATIVE
Ketones, UA: NEGATIVE
Leukocytes, UA: NEGATIVE
Nitrite, UA: NEGATIVE
Protein, UA: NEGATIVE

## 2013-02-01 MED ORDER — ACYCLOVIR 400 MG PO TABS: 400.0000 mg | ORAL_TABLET | Freq: Three times a day (TID) | ORAL | Status: DC

## 2013-02-01 NOTE — Progress Notes (Signed)
Reports good fm. Denies uc's, lof, vb, urinary frequency, urgency, hesitancy, or dysuria.  Occ pinching sensation postvoiding x ~1wk. Urine neg today.  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 Discussed contraception at length. Thinking about BTL vs. Nexplanon. Reviewed today's u/s, ptl s/s, fkc.  All questions answered. F/U in 2wks for visit.

## 2013-02-01 NOTE — Patient Instructions (Signed)
Third Trimester of Pregnancy  The third trimester is from week 29 through week 42, months 7 through 9. The third trimester is a time when the fetus is growing rapidly. At the end of the ninth month, the fetus is about 20 inches in length and weighs 6 10 pounds.   BODY CHANGES  Your body goes through many changes during pregnancy. The changes vary from woman to woman.    Your weight will continue to increase. You can expect to gain 25 35 pounds (11 16 kg) by the end of the pregnancy.   You may begin to get stretch marks on your hips, abdomen, and breasts.   You may urinate more often because the fetus is moving lower into your pelvis and pressing on your bladder.   You may develop or continue to have heartburn as a result of your pregnancy.   You may develop constipation because certain hormones are causing the muscles that push waste through your intestines to slow down.   You may develop hemorrhoids or swollen, bulging veins (varicose veins).   You may have pelvic pain because of the weight gain and pregnancy hormones relaxing your joints between the bones in your pelvis. Back aches may result from over exertion of the muscles supporting your posture.   Your breasts will continue to grow and be tender. A yellow discharge may leak from your breasts called colostrum.   Your belly button may stick out.   You may feel short of breath because of your expanding uterus.   You may notice the fetus "dropping," or moving lower in your abdomen.   You may have a bloody mucus discharge. This usually occurs a few days to a week before labor begins.   Your cervix becomes thin and soft (effaced) near your due date.  WHAT TO EXPECT AT YOUR PRENATAL EXAMS   You will have prenatal exams every 2 weeks until week 36. Then, you will have weekly prenatal exams. During a routine prenatal visit:   You will be weighed to make sure you and the fetus are growing normally.   Your blood pressure is taken.   Your abdomen will be  measured to track your baby's growth.   The fetal heartbeat will be listened to.   Any test results from the previous visit will be discussed.   You may have a cervical check near your due date to see if you have effaced.  At around 36 weeks, your caregiver will check your cervix. At the same time, your caregiver will also perform a test on the secretions of the vaginal tissue. This test is to determine if a type of bacteria, Group B streptococcus, is present. Your caregiver will explain this further.  Your caregiver may ask you:   What your birth plan is.   How you are feeling.   If you are feeling the baby move.   If you have had any abnormal symptoms, such as leaking fluid, bleeding, severe headaches, or abdominal cramping.   If you have any questions.  Other tests or screenings that may be performed during your third trimester include:   Blood tests that check for low iron levels (anemia).   Fetal testing to check the health, activity level, and growth of the fetus. Testing is done if you have certain medical conditions or if there are problems during the pregnancy.  FALSE LABOR  You may feel small, irregular contractions that eventually go away. These are called Braxton Hicks contractions, or   false labor. Contractions may last for hours, days, or even weeks before true labor sets in. If contractions come at regular intervals, intensify, or become painful, it is best to be seen by your caregiver.   SIGNS OF LABOR    Menstrual-like cramps.   Contractions that are 5 minutes apart or less.   Contractions that start on the top of the uterus and spread down to the lower abdomen and back.   A sense of increased pelvic pressure or back pain.   A watery or bloody mucus discharge that comes from the vagina.  If you have any of these signs before the 37th week of pregnancy, call your caregiver right away. You need to go to the hospital to get checked immediately.  HOME CARE INSTRUCTIONS    Avoid all  smoking, herbs, alcohol, and unprescribed drugs. These chemicals affect the formation and growth of the baby.   Follow your caregiver's instructions regarding medicine use. There are medicines that are either safe or unsafe to take during pregnancy.   Exercise only as directed by your caregiver. Experiencing uterine cramps is a good sign to stop exercising.   Continue to eat regular, healthy meals.   Wear a good support bra for breast tenderness.   Do not use hot tubs, steam rooms, or saunas.   Wear your seat belt at all times when driving.   Avoid raw meat, uncooked cheese, cat litter boxes, and soil used by cats. These carry germs that can cause birth defects in the baby.   Take your prenatal vitamins.   Try taking a stool softener (if your caregiver approves) if you develop constipation. Eat more high-fiber foods, such as fresh vegetables or fruit and whole grains. Drink plenty of fluids to keep your urine clear or pale yellow.   Take warm sitz baths to soothe any pain or discomfort caused by hemorrhoids. Use hemorrhoid cream if your caregiver approves.   If you develop varicose veins, wear support hose. Elevate your feet for 15 minutes, 3 4 times a day. Limit salt in your diet.   Avoid heavy lifting, wear low heal shoes, and practice good posture.   Rest a lot with your legs elevated if you have leg cramps or low back pain.   Visit your dentist if you have not gone during your pregnancy. Use a soft toothbrush to brush your teeth and be gentle when you floss.   A sexual relationship may be continued unless your caregiver directs you otherwise.   Do not travel far distances unless it is absolutely necessary and only with the approval of your caregiver.   Take prenatal classes to understand, practice, and ask questions about the labor and delivery.   Make a trial run to the hospital.   Pack your hospital bag.   Prepare the baby's nursery.   Continue to go to all your prenatal visits as directed  by your caregiver.  SEEK MEDICAL CARE IF:   You are unsure if you are in labor or if your water has broken.   You have dizziness.   You have mild pelvic cramps, pelvic pressure, or nagging pain in your abdominal area.   You have persistent nausea, vomiting, or diarrhea.   You have a bad smelling vaginal discharge.   You have pain with urination.  SEEK IMMEDIATE MEDICAL CARE IF:    You have a fever.   You are leaking fluid from your vagina.   You have spotting or bleeding from your vagina.     You have severe abdominal cramping or pain.   You have rapid weight loss or gain.   You have shortness of breath with chest pain.   You notice sudden or extreme swelling of your face, hands, ankles, feet, or legs.   You have not felt your baby move in over an hour.   You have severe headaches that do not go away with medicine.   You have vision changes.  Document Released: 02/25/2001 Document Revised: 11/03/2012 Document Reviewed: 05/04/2012  [employer] Patient Information 2014 [employer]

## 2013-02-01 NOTE — Progress Notes (Signed)
U/S(32+2wks)-active fetus, BPP 8/8, fluid WNL AFI-9.9cm, appropriate growth EFw 3 lb 12 oz (28th%tile), posterior Gr 1 placenta, anterior fibroid=2.6cm, female fetus

## 2013-02-15 ENCOUNTER — Ambulatory Visit (INDEPENDENT_AMBULATORY_CARE_PROVIDER_SITE_OTHER): Payer: BC Managed Care – PPO | Admitting: Women's Health

## 2013-02-15 ENCOUNTER — Encounter: Payer: Self-pay | Admitting: Women's Health

## 2013-02-15 VITALS — BP 92/44 | Wt 191.2 lb

## 2013-02-15 DIAGNOSIS — O09529 Supervision of elderly multigravida, unspecified trimester: Secondary | ICD-10-CM

## 2013-02-15 DIAGNOSIS — O09523 Supervision of elderly multigravida, third trimester: Secondary | ICD-10-CM

## 2013-02-15 DIAGNOSIS — R768 Other specified abnormal immunological findings in serum: Secondary | ICD-10-CM

## 2013-02-15 DIAGNOSIS — Z331 Pregnant state, incidental: Secondary | ICD-10-CM

## 2013-02-15 DIAGNOSIS — O99019 Anemia complicating pregnancy, unspecified trimester: Secondary | ICD-10-CM

## 2013-02-15 DIAGNOSIS — Z1389 Encounter for screening for other disorder: Secondary | ICD-10-CM

## 2013-02-15 LAB — POCT URINALYSIS DIPSTICK
Glucose, UA: NEGATIVE
Ketones, UA: NEGATIVE
Nitrite, UA: NEGATIVE
Protein, UA: NEGATIVE

## 2013-02-15 NOTE — Progress Notes (Signed)
Reports good fm. Denies uc's, lof, vb, urinary frequency, urgency, hesitancy, or dysuria.  Gets easily exhausted at work, wants to cut hours back- note given.  Decided for BTL, reviewed r/b, consent signed today. Reviewed ptl s/s, fkc.  All questions answered. F/U in 2wks for visit.

## 2013-02-15 NOTE — Patient Instructions (Signed)
Preterm Labor Information Preterm labor is when labor starts at less than 37 weeks of pregnancy. The normal length of a pregnancy is 39 to 41 weeks. CAUSES Often, there is no identifiable underlying cause as to why a woman goes into preterm labor. One of the most common known causes of preterm labor is infection. Infections of the uterus, cervix, vagina, amniotic sac, bladder, kidney, or even the lungs (pneumonia) can cause labor to start. Other suspected causes of preterm labor include:   Urogenital infections, such as yeast infections and bacterial vaginosis.   Uterine abnormalities (uterine shape, uterine septum, fibroids, or bleeding from the placenta).   A cervix that has been operated on (it may fail to stay closed).   Malformations in the fetus.   Multiple gestations (twins, triplets, and so on).   Breakage of the amniotic sac.  RISK FACTORS  Having a previous history of preterm labor.   Having premature rupture of membranes (PROM).   Having a placenta that covers the opening of the cervix (placenta previa).   Having a placenta that separates from the uterus (placental abruption).   Having a cervix that is too weak to hold the fetus in the uterus (incompetent cervix).   Having too much fluid in the amniotic sac (polyhydramnios).   Taking illegal drugs or smoking while pregnant.   Not gaining enough weight while pregnant.   Being younger than 18 and older than 35 years old.   Having a low socioeconomic status.   Being African American. SYMPTOMS Signs and symptoms of preterm labor include:   Menstrual-like cramps, abdominal pain, or back pain.  Uterine contractions that are regular, as frequent as six in an hour, regardless of their intensity (may be mild or painful).  Contractions that start on the top of the uterus and spread down to the lower abdomen and back.   A sense of increased pelvic pressure.   A watery or bloody mucus discharge that  comes from the vagina.  TREATMENT Depending on the length of the pregnancy and other circumstances, your health care provider may suggest bed rest. If necessary, there are medicines that can be given to stop contractions and to mature the fetal lungs. If labor happens before 34 weeks of pregnancy, a prolonged hospital stay may be recommended. Treatment depends on the condition of both you and the fetus.  WHAT SHOULD YOU DO IF YOU THINK YOU ARE IN PRETERM LABOR? Call your health care provider right away. You will need to go to the hospital to get checked immediately. HOW CAN YOU PREVENT PRETERM LABOR IN FUTURE PREGNANCIES? You should:   Stop smoking if you smoke.  Maintain healthy weight gain and avoid chemicals and drugs that are not necessary.  Be watchful for any type of infection.  Inform your health care provider if you have a known history of preterm labor. Document Released: 05/24/2003 Document Revised: 11/03/2012 Document Reviewed: 04/05/2012 [employer] Patient Information 2014 [employer].    

## 2013-03-01 ENCOUNTER — Ambulatory Visit (INDEPENDENT_AMBULATORY_CARE_PROVIDER_SITE_OTHER): Payer: BC Managed Care – PPO | Admitting: Women's Health

## 2013-03-01 ENCOUNTER — Encounter: Payer: Self-pay | Admitting: Women's Health

## 2013-03-01 VITALS — BP 100/60 | Wt 193.4 lb

## 2013-03-01 DIAGNOSIS — O99019 Anemia complicating pregnancy, unspecified trimester: Secondary | ICD-10-CM

## 2013-03-01 DIAGNOSIS — Z131 Encounter for screening for diabetes mellitus: Secondary | ICD-10-CM

## 2013-03-01 DIAGNOSIS — R81 Glycosuria: Secondary | ICD-10-CM

## 2013-03-01 DIAGNOSIS — Z3483 Encounter for supervision of other normal pregnancy, third trimester: Secondary | ICD-10-CM

## 2013-03-01 DIAGNOSIS — O98519 Other viral diseases complicating pregnancy, unspecified trimester: Secondary | ICD-10-CM

## 2013-03-01 DIAGNOSIS — Z331 Pregnant state, incidental: Secondary | ICD-10-CM

## 2013-03-01 DIAGNOSIS — O09529 Supervision of elderly multigravida, unspecified trimester: Secondary | ICD-10-CM

## 2013-03-01 DIAGNOSIS — Z1389 Encounter for screening for other disorder: Secondary | ICD-10-CM

## 2013-03-01 LAB — POCT URINALYSIS DIPSTICK
Ketones, UA: NEGATIVE
Nitrite, UA: NEGATIVE
Protein, UA: NEGATIVE

## 2013-03-01 LAB — GLUCOSE, POCT (MANUAL RESULT ENTRY): POC Glucose: 89 mg/dl (ref 70–99)

## 2013-03-01 NOTE — Patient Instructions (Signed)
Breastfeeding Deciding to breastfeed is one of the best choices you can make for you and your baby. A change in hormones during pregnancy causes your breast tissue to grow and increases the number and size of your milk ducts. These hormones also allow proteins, sugars, and fats from your blood supply to make breast milk in your milk-producing glands. Hormones prevent breast milk from being released before your baby is born as well as prompt milk flow after birth. Once breastfeeding has begun, thoughts of your baby, as well as his or her sucking or crying, can stimulate the release of milk from your milk-producing glands.  BENEFITS OF BREASTFEEDING For Your Baby  Your first milk (colostrum) helps your baby's digestive system function better.   There are antibodies in your milk that help your baby fight off infections.   Your baby has a lower incidence of asthma, allergies, and sudden infant death syndrome.   The nutrients in breast milk are better for your baby than infant formulas and are designed uniquely for your baby's needs.   Breast milk improves your baby's brain development.   Your baby is less likely to develop other conditions, such as childhood obesity, asthma, or type 2 diabetes mellitus.  For You   Breastfeeding helps to create a very special bond between you and your baby.   Breastfeeding is convenient. Breast milk is always available at the correct temperature and costs nothing.   Breastfeeding helps to burn calories and helps you lose the weight gained during pregnancy.   Breastfeeding makes your uterus contract to its prepregnancy size faster and slows bleeding (lochia) after you give birth.   Breastfeeding helps to lower your risk of developing type 2 diabetes mellitus, osteoporosis, and breast or ovarian cancer later in life. SIGNS THAT YOUR BABY IS HUNGRY Early Signs of Hunger  Increased alertness or activity.  Stretching.  Movement of the head from  side to side.  Movement of the head and opening of the mouth when the corner of the mouth or cheek is stroked (rooting).  Increased sucking sounds, smacking lips, cooing, sighing, or squeaking.  Hand-to-mouth movements.  Increased sucking of fingers or hands. Late Signs of Hunger  Fussing.  Intermittent crying. Extreme Signs of Hunger Signs of extreme hunger will require calming and consoling before your baby will be able to breastfeed successfully. Do not wait for the following signs of extreme hunger to occur before you initiate breastfeeding:   Restlessness.  A loud, strong cry.   Screaming. BREASTFEEDING BASICS Breastfeeding Initiation  Find a comfortable place to sit or lie down, with your neck and back well supported.  Place a pillow or rolled up blanket under your baby to bring him or her to the level of your breast (if you are seated). Nursing pillows are specially designed to help support your arms and your baby while you breastfeed.  Make sure that your baby's abdomen is facing your abdomen.   Gently massage your breast. With your fingertips, massage from your chest wall toward your nipple in a circular motion. This encourages milk flow. You may need to continue this action during the feeding if your milk flows slowly.  Support your breast with 4 fingers underneath and your thumb above your nipple. Make sure your fingers are well away from your nipple and your baby's mouth.   Stroke your baby's lips gently with your finger or nipple.   When your baby's mouth is open wide enough, quickly bring your baby to your   breast, placing your entire nipple and as much of the colored area around your nipple (areola) as possible into your baby's mouth.   More areola should be visible above your baby's upper lip than below the lower lip.   Your baby's tongue should be between his or her lower gum and your breast.   Ensure that your baby's mouth is correctly positioned  around your nipple (latched). Your baby's lips should create a seal on your breast and be turned out (everted).  It is common for your baby to suck about 2 3 minutes in order to start the flow of breast milk. Latching Teaching your baby how to latch on to your breast properly is very important. An improper latch can cause nipple pain and decreased milk supply for you and poor weight gain in your baby. Also, if your baby is not latched onto your nipple properly, he or she may swallow some air during feeding. This can make your baby fussy. Burping your baby when you switch breasts during the feeding can help to get rid of the air. However, teaching your baby to latch on properly is still the best way to prevent fussiness from swallowing air while breastfeeding. Signs that your baby has successfully latched on to your nipple:    Silent tugging or silent sucking, without causing you pain.   Swallowing heard between every 3 4 sucks.    Muscle movement above and in front of his or her ears while sucking.  Signs that your baby has not successfully latched on to nipple:   Sucking sounds or smacking sounds from your baby while breastfeeding.  Nipple pain. If you think your baby has not latched on correctly, slip your finger into the corner of your baby's mouth to break the suction and place it between your baby's gums. Attempt breastfeeding initiation again. Signs of Successful Breastfeeding Signs from your baby:   A gradual decrease in the number of sucks or complete cessation of sucking.   Falling asleep.   Relaxation of his or her body.   Retention of a small amount of milk in his or her mouth.   Letting go of your breast by himself or herself. Signs from you:  Breasts that have increased in firmness, weight, and size 1 3 hours after feeding.   Breasts that are softer immediately after breastfeeding.  Increased milk volume, as well as a change in milk consistency and color by  the 5th day of breastfeeding.   Nipples that are not sore, cracked, or bleeding. Signs That Your Baby is Getting Enough Milk  Wetting at least 3 diapers in a 24-hour period. The urine should be clear and pale yellow by age 5 days.  At least 3 stools in a 24-hour period by age 5 days. The stool should be soft and yellow.  At least 3 stools in a 24-hour period by age 7 days. The stool should be seedy and yellow.  No loss of weight greater than 10% of birth weight during the first 3 days of age.  Average weight gain of 4 7 ounces (120 210 mL) per week after age 4 days.  Consistent daily weight gain by age 5 days, without weight loss after the age of 2 weeks. After a feeding, your baby may spit up a small amount. This is common. BREASTFEEDING FREQUENCY AND DURATION Frequent feeding will help you make more milk and can prevent sore nipples and breast engorgement. Breastfeed when you feel the need to reduce   the fullness of your breasts or when your baby shows signs of hunger. This is called "breastfeeding on demand." Avoid introducing a pacifier to your baby while you are working to establish breastfeeding (the first 4 6 weeks after your baby is born). After this time you may choose to use a pacifier. Research has shown that pacifier use during the first year of a baby's life decreases the risk of sudden infant death syndrome (SIDS). Allow your baby to feed on each breast as long as he or she wants. Breastfeed until your baby is finished feeding. When your baby unlatches or falls asleep while feeding from the first breast, offer the second breast. Because newborns are often sleepy in the first few weeks of life, you may need to awaken your baby to get him or her to feed. Breastfeeding times will vary from baby to baby. However, the following rules can serve as a guide to help you ensure that your baby is properly fed:  Newborns (babies 4 weeks of age or younger) may breastfeed every 1 3  hours.  Newborns should not go longer than 3 hours during the day or 5 hours during the night without breastfeeding.  You should breastfeed your baby a minimum of 8 times in a 24-hour period until you begin to introduce solid foods to your baby at around 6 months of age. BREAST MILK PUMPING Pumping and storing breast milk allows you to ensure that your baby is exclusively fed your breast milk, even at times when you are unable to breastfeed. This is especially important if you are going back to work while you are still breastfeeding or when you are not able to be present during feedings. Your lactation consultant can give you guidelines on how long it is safe to store breast milk.  A breast pump is a machine that allows you to pump milk from your breast into a sterile bottle. The pumped breast milk can then be stored in a refrigerator or freezer. Some breast pumps are operated by hand, while others use electricity. Ask your lactation consultant which type will work best for you. Breast pumps can be purchased, but some hospitals and breastfeeding support groups lease breast pumps on a monthly basis. A lactation consultant can teach you how to hand express breast milk, if you prefer not to use a pump.  CARING FOR YOUR BREASTS WHILE YOU BREASTFEED Nipples can become dry, cracked, and sore while breastfeeding. The following recommendations can help keep your breasts moisturized and healthy:  Avoid using soap on your nipples.   Wear a supportive bra. Although not required, special nursing bras and tank tops are designed to allow access to your breasts for breastfeeding without taking off your entire bra or top. Avoid wearing underwire style bras or extremely tight bras.  Air dry your nipples for 3 4minutes after each feeding.   Use only cotton bra pads to absorb leaked breast milk. Leaking of breast milk between feedings is normal.   Use lanolin on your nipples after breastfeeding. Lanolin helps to  maintain your skin's normal moisture barrier. If you use pure lanolin you do not need to wash it off before feeding your baby again. Pure lanolin is not toxic to your baby. You may also hand express a few drops of breast milk and gently massage that milk into your nipples and allow the milk to air dry. In the first few weeks after giving birth, some women experience extremely full breasts (engorgement). Engorgement can make   your breasts feel heavy, warm, and tender to the touch. Engorgement peaks within 3 5 days after you give birth. The following recommendations can help ease engorgement:  Completely empty your breasts while breastfeeding or pumping. You may want to start by applying warm, moist heat (in the shower or with warm water-soaked hand towels) just before feeding or pumping. This increases circulation and helps the milk flow. If your baby does not completely empty your breasts while breastfeeding, pump any extra milk after he or she is finished.  Wear a snug bra (nursing or regular) or tank top for 1 2 days to signal your body to slightly decrease milk production.  Apply ice packs to your breasts, unless this is too uncomfortable for you.  Make sure that your baby is latched on and positioned properly while breastfeeding. If engorgement persists after 48 hours of following these recommendations, contact your health care provider or a lactation consultant. OVERALL HEALTH CARE RECOMMENDATIONS WHILE BREASTFEEDING  Eat healthy foods. Alternate between meals and snacks, eating 3 of each per day. Because what you eat affects your breast milk, some of the foods may make your baby more irritable than usual. Avoid eating these foods if you are sure that they are negatively affecting your baby.  Drink milk, fruit juice, and water to satisfy your thirst (about 10 glasses a day).   Rest often, relax, and continue to take your prenatal vitamins to prevent fatigue, stress, and anemia.  Continue  breast self-awareness checks.  Avoid chewing and smoking tobacco.  Avoid alcohol and drug use. Some medicines that may be harmful to your baby can pass through breast milk. It is important to ask your health care provider before taking any medicine, including all over-the-counter and prescription medicine as well as vitamin and herbal supplements. It is possible to become pregnant while breastfeeding. If birth control is desired, ask your health care provider about options that will be safe for your baby. SEEK MEDICAL CARE IF:   You feel like you want to stop breastfeeding or have become frustrated with breastfeeding.  You have painful breasts or nipples.  Your nipples are cracked or bleeding.  Your breasts are red, tender, or warm.  You have a swollen area on either breast.  You have a fever or chills.  You have nausea or vomiting.  You have drainage other than breast milk from your nipples.  Your breasts do not become full before feedings by the 5th day after you give birth.  You feel sad and depressed.  Your baby is too sleepy to eat well.  Your baby is having trouble sleeping.   Your baby is wetting less than 3 diapers in a 24-hour period.  Your baby has less than 3 stools in a 24-hour period.  Your baby's skin or the white part of his or her eyes becomes yellow.   Your baby is not gaining weight by 5 days of age. SEEK IMMEDIATE MEDICAL CARE IF:   Your baby is overly tired (lethargic) and does not want to wake up and feed.  Your baby develops an unexplained fever. Document Released: 03/03/2005 Document Revised: 11/03/2012 Document Reviewed: 08/25/2012 Exit[employer] Patient Information 2014 [employer].  

## 2013-03-01 NOTE — Progress Notes (Signed)
Reports good fm. Denies regular uc's, lof, vb, urinary frequency, urgency, hesitancy, or dysuria.  No complaints.  Reviewed ptl s/s, fkc.  All questions answered. F/U in 1wk for visit and gbs.

## 2013-03-08 ENCOUNTER — Ambulatory Visit (INDEPENDENT_AMBULATORY_CARE_PROVIDER_SITE_OTHER): Payer: BC Managed Care – PPO | Admitting: Women's Health

## 2013-03-08 VITALS — BP 110/58 | Wt 195.0 lb

## 2013-03-08 DIAGNOSIS — O98519 Other viral diseases complicating pregnancy, unspecified trimester: Secondary | ICD-10-CM

## 2013-03-08 DIAGNOSIS — O99019 Anemia complicating pregnancy, unspecified trimester: Secondary | ICD-10-CM

## 2013-03-08 DIAGNOSIS — R82998 Other abnormal findings in urine: Secondary | ICD-10-CM

## 2013-03-08 DIAGNOSIS — Z3480 Encounter for supervision of other normal pregnancy, unspecified trimester: Secondary | ICD-10-CM

## 2013-03-08 DIAGNOSIS — O09523 Supervision of elderly multigravida, third trimester: Secondary | ICD-10-CM

## 2013-03-08 DIAGNOSIS — Z331 Pregnant state, incidental: Secondary | ICD-10-CM

## 2013-03-08 DIAGNOSIS — Z1389 Encounter for screening for other disorder: Secondary | ICD-10-CM

## 2013-03-08 DIAGNOSIS — O09529 Supervision of elderly multigravida, unspecified trimester: Secondary | ICD-10-CM

## 2013-03-08 LAB — POCT URINALYSIS DIPSTICK
Blood, UA: 1
Glucose, UA: NEGATIVE
Ketones, UA: NEGATIVE
Nitrite, UA: NEGATIVE
Protein, UA: NEGATIVE

## 2013-03-08 LAB — OB RESULTS CONSOLE GC/CHLAMYDIA
Chlamydia: NEGATIVE
Gonorrhea: NEGATIVE

## 2013-03-08 LAB — OB RESULTS CONSOLE GBS: GBS: NEGATIVE

## 2013-03-08 NOTE — Progress Notes (Signed)
Reports good fm. Denies uc's, lof, vb, urinary frequency, urgency, hesitancy, or dysuria.  No complaints. 3+leuks in urine, will send for cx. Requests SVE: 1.5/th/ballotable, vtx. Reviewed labor s/s, fkc.  All questions answered. F/U in 1wk for visit.  GBS, gc/ch today.

## 2013-03-08 NOTE — Patient Instructions (Signed)
Call the office or go to [facility] if:  You begin to have strong, frequent contractions  Your water breaks.  Sometimes it is a big gush of fluid, sometimes it is just a trickle that keeps getting your panties wet or running down your legs  You have vaginal bleeding.  It is normal to have a small amount of spotting if your cervix was checked.   You don't feel your baby moving like normal.  If you don't, get you something to eat and drink and lay down and focus on feeling your baby move.  You should feel at least 10 movements in 2 hours.  If you don't, you should call the office or go to [facility].   Braxton Hicks Contractions Pregnancy is commonly associated with contractions of the uterus throughout the pregnancy. Towards the end of pregnancy (32 to 34 weeks), these contractions (Braxton Hicks) can develop more often and may become more forceful. This is not true labor because these contractions do not result in opening (dilatation) and thinning of the cervix. They are sometimes difficult to tell apart from true labor because these contractions can be forceful and people have different pain tolerances. You should not feel embarrassed if you go to the hospital with false labor. Sometimes, the only way to tell if you are in true labor is for your caregiver to follow the changes in the cervix. How to tell the difference between true and false labor:  False labor.  The contractions of false labor are usually shorter, irregular and not as hard as those of true labor.  They are often felt in the front of the lower abdomen and in the groin.  They may leave with walking around or changing positions while lying down.  They get weaker and are shorter lasting as time goes on.  These contractions are usually irregular.  They do not usually become progressively stronger, regular and closer together as with true labor.  True labor.  Contractions in true labor last 30 to 70 seconds,  become very regular, usually become more intense, and increase in frequency.  They do not go away with walking.  The discomfort is usually felt in the top of the uterus and spreads to the lower abdomen and low back.  True labor can be determined by your caregiver with an exam. This will show that the cervix is dilating and getting thinner. If there are no prenatal problems or other health problems associated with the pregnancy, it is completely safe to be sent home with false labor and await the onset of true labor. HOME CARE INSTRUCTIONS   Keep up with your usual exercises and instructions.  Take medications as directed.  Keep your regular prenatal appointment.  Eat and drink lightly if you think you are going into labor.  If BH contractions are making you uncomfortable:  Change your activity position from lying down or resting to walking/walking to resting.  Sit and rest in a tub of warm water.  Drink 2 to 3 glasses of water. Dehydration may cause B-H contractions.  Do slow and deep breathing several times an hour. SEEK IMMEDIATE MEDICAL CARE IF:   Your contractions continue to become stronger, more regular, and closer together.  You have a gushing, burst or leaking of fluid from the vagina.  An oral temperature above 102 F (38.9 C) develops.  You have passage of blood-tinged mucus.  You develop vaginal bleeding.  You develop continuous belly (abdominal) pain.  You have   low back pain that you never had before.  You feel the baby's head pushing down causing pelvic pressure.  The baby is not moving as much as it used to. Document Released: 03/03/2005 Document Revised: 05/26/2011 Document Reviewed: 08/25/2008 [employer] Patient Information 2014 [employer].  

## 2013-03-09 LAB — GC/CHLAMYDIA PROBE AMP
CT Probe RNA: NEGATIVE
GC Probe RNA: NEGATIVE

## 2013-03-10 LAB — URINE CULTURE: Colony Count: >=100000

## 2013-03-10 LAB — CULTURE, BETA STREP (GROUP B ONLY)

## 2013-03-12 ENCOUNTER — Encounter: Payer: Self-pay | Admitting: Women's Health

## 2013-03-16 ENCOUNTER — Encounter: Payer: Self-pay | Admitting: Obstetrics and Gynecology

## 2013-03-16 ENCOUNTER — Ambulatory Visit (INDEPENDENT_AMBULATORY_CARE_PROVIDER_SITE_OTHER): Payer: BC Managed Care – PPO | Admitting: Obstetrics and Gynecology

## 2013-03-16 VITALS — BP 110/60 | Wt 194.0 lb

## 2013-03-16 DIAGNOSIS — O09529 Supervision of elderly multigravida, unspecified trimester: Secondary | ICD-10-CM

## 2013-03-16 DIAGNOSIS — Z1389 Encounter for screening for other disorder: Secondary | ICD-10-CM

## 2013-03-16 DIAGNOSIS — O98519 Other viral diseases complicating pregnancy, unspecified trimester: Secondary | ICD-10-CM

## 2013-03-16 DIAGNOSIS — O99019 Anemia complicating pregnancy, unspecified trimester: Secondary | ICD-10-CM

## 2013-03-16 DIAGNOSIS — O210 Mild hyperemesis gravidarum: Secondary | ICD-10-CM

## 2013-03-16 DIAGNOSIS — Z331 Pregnant state, incidental: Secondary | ICD-10-CM

## 2013-03-16 DIAGNOSIS — O09523 Supervision of elderly multigravida, third trimester: Secondary | ICD-10-CM

## 2013-03-16 LAB — POCT URINALYSIS DIPSTICK
Blood, UA: NEGATIVE
Glucose, UA: NEGATIVE
Ketones, UA: NEGATIVE
Leukocytes, UA: NEGATIVE
Nitrite, UA: NEGATIVE
Protein, UA: NEGATIVE

## 2013-03-16 NOTE — Progress Notes (Signed)
Pt noticing increasing pressure, no bleeding or ROM, + increasing dischg.

## 2013-03-16 NOTE — Progress Notes (Signed)
Pt states that she has been having some irregular contractions past few days and having some pressure with increased discharge.

## 2013-03-17 NOTE — L&D Delivery Note (Signed)
Attestation of Attending Supervision of Advanced Practitioner (CNM/NP): Evaluation and management procedures were performed by the Advanced Practitioner under my supervision and collaboration.  I have reviewed the Advanced Practitioner's note and chart, and I agree with the management and plan.  HARRAWAY-SMITH, Shammond Arave 2:58 PM     

## 2013-03-17 NOTE — L&D Delivery Note (Signed)
Delivery Note  After 3 pushes, at 2018  a viable female was delivered via  (Presentation LOA: ).  APGAR: 9/9 ; weight pending.  40 units of pitocin diluted in 1000cc LR was infused rapidly IV.  The placenta separated spontaneously and delivered via CCT and maternal pushing effort.  It was inspected and appears to be intact with a 3 VC.  There were the following complications:   Anesthesia: Epidural  Episiotomy: none Lacerations: none Suture Repair: n/a Est. Blood Loss (mL):  100  Delivery by Dr. Sena Slate under my supervision.  Mom to postpartum.  Baby to Couplet care / Skin to Skin.

## 2013-03-22 ENCOUNTER — Inpatient Hospital Stay (HOSPITAL_COMMUNITY): Payer: BC Managed Care – PPO

## 2013-03-22 ENCOUNTER — Encounter (HOSPITAL_COMMUNITY): Payer: Self-pay | Admitting: *Deleted

## 2013-03-22 ENCOUNTER — Inpatient Hospital Stay (HOSPITAL_COMMUNITY)
Admission: AD | Admit: 2013-03-22 | Discharge: 2013-03-22 | Disposition: A | Discharge status: Home / Self Care | Payer: BC Managed Care – PPO | Source: Ambulatory Visit | Attending: Obstetrics and Gynecology | Admitting: Obstetrics and Gynecology

## 2013-03-22 DIAGNOSIS — O4700 False labor before 37 completed weeks of gestation, unspecified trimester: Secondary | ICD-10-CM | POA: Insufficient documentation

## 2013-03-22 HISTORY — DX: Herpesviral infection, unspecified: B00.9

## 2013-03-22 MED ORDER — OXYCODONE-ACETAMINOPHEN 5-325 MG PO TABS
1.0000 | ORAL_TABLET | Freq: Once | ORAL | Status: AC | PRN
  Administered 2013-03-22: 1 via ORAL
  Filled 2013-03-22: qty 1

## 2013-03-22 MED ORDER — OXYCODONE-ACETAMINOPHEN 5-325 MG PO TABS: 1.0000 | ORAL_TABLET | Freq: Once | ORAL | Status: DC | PRN

## 2013-03-22 NOTE — Discharge Instructions (Signed)
Keep your scheduled appointment for prenatal care. Return to [facility] as needed. °

## 2013-03-22 NOTE — [facility]U Note (Signed)
Patient states she been having contractions every 5 minutes with bloody show. Denies leaking and reports good fetal movement.

## 2013-03-23 ENCOUNTER — Encounter: Payer: Self-pay | Admitting: Women's Health

## 2013-03-23 ENCOUNTER — Ambulatory Visit (INDEPENDENT_AMBULATORY_CARE_PROVIDER_SITE_OTHER): Payer: BC Managed Care – PPO | Admitting: Women's Health

## 2013-03-23 VITALS — BP 102/60 | Wt 193.5 lb

## 2013-03-23 DIAGNOSIS — Z3483 Encounter for supervision of other normal pregnancy, third trimester: Secondary | ICD-10-CM

## 2013-03-23 DIAGNOSIS — Z331 Pregnant state, incidental: Secondary | ICD-10-CM

## 2013-03-23 DIAGNOSIS — O09529 Supervision of elderly multigravida, unspecified trimester: Secondary | ICD-10-CM

## 2013-03-23 DIAGNOSIS — O98519 Other viral diseases complicating pregnancy, unspecified trimester: Secondary | ICD-10-CM

## 2013-03-23 DIAGNOSIS — Z1389 Encounter for screening for other disorder: Secondary | ICD-10-CM

## 2013-03-23 DIAGNOSIS — O99019 Anemia complicating pregnancy, unspecified trimester: Secondary | ICD-10-CM

## 2013-03-23 LAB — POCT URINALYSIS DIPSTICK
Glucose, UA: NEGATIVE
Ketones, UA: NEGATIVE
Nitrite, UA: NEGATIVE
Protein, UA: NEGATIVE

## 2013-03-23 NOTE — Patient Instructions (Signed)
Call the office or go to [facility] if:  You begin to have strong, frequent contractions  Your water breaks.  Sometimes it is a big gush of fluid, sometimes it is just a trickle that keeps getting your panties wet or running down your legs  You have vaginal bleeding.  It is normal to have a small amount of spotting if your cervix was checked.   You don't feel your baby moving like normal.  If you don't, get you something to eat and drink and lay down and focus on feeling your baby move.  You should feel at least 10 movements in 2 hours.  If you don't, you should call the office or go to [facility].

## 2013-03-23 NOTE — Progress Notes (Signed)
Reports good fm. Denies lof, vb, urinary frequency, urgency, hesitancy, or dysuria.  UCs q 5-45mins and uncomfortable. Went to [facility] yesterday am and was 2cm, per pt her nurse stated baby wasn't moving a lot, so she had bpp which was 8/8. She states she went before she had a chance to eat breakfast, and he normally begins moving after breakfast. He has been moving normally since.  Requests sve to see if there has been any change since yesterday. Tight 3/80/-2, vtx, slight bbow. Offered membrane sweeping, discussed r/b- pt decided to proceed, so membranes swept. Reviewed labor s/s, fkc.  All questions answered. F/U in 1wk for visit.

## 2013-03-24 ENCOUNTER — Encounter (HOSPITAL_COMMUNITY): Payer: BC Managed Care – PPO | Admitting: Anesthesiology

## 2013-03-24 ENCOUNTER — Encounter (HOSPITAL_COMMUNITY): Payer: Self-pay | Admitting: *Deleted

## 2013-03-24 ENCOUNTER — Inpatient Hospital Stay (HOSPITAL_COMMUNITY)
Admission: AD | Admit: 2013-03-24 | Discharge: 2013-03-26 | DRG: 767 | Disposition: A | Discharge status: Home / Self Care | Payer: BC Managed Care – PPO | Source: Ambulatory Visit | Attending: Obstetrics & Gynecology | Admitting: Obstetrics & Gynecology

## 2013-03-24 ENCOUNTER — Inpatient Hospital Stay (HOSPITAL_COMMUNITY): Payer: BC Managed Care – PPO | Admitting: Anesthesiology

## 2013-03-24 DIAGNOSIS — O09529 Supervision of elderly multigravida, unspecified trimester: Principal | ICD-10-CM | POA: Diagnosis present

## 2013-03-24 DIAGNOSIS — Z302 Encounter for sterilization: Secondary | ICD-10-CM

## 2013-03-24 DIAGNOSIS — R768 Other specified abnormal immunological findings in serum: Secondary | ICD-10-CM

## 2013-03-24 DIAGNOSIS — IMO0001 Reserved for inherently not codable concepts without codable children: Secondary | ICD-10-CM

## 2013-03-24 DIAGNOSIS — D219 Benign neoplasm of connective and other soft tissue, unspecified: Secondary | ICD-10-CM

## 2013-03-24 DIAGNOSIS — O09523 Supervision of elderly multigravida, third trimester: Secondary | ICD-10-CM

## 2013-03-24 DIAGNOSIS — Z1211 Encounter for screening for malignant neoplasm of colon: Secondary | ICD-10-CM

## 2013-03-24 LAB — CBC
HCT: 35.5 % — ABNORMAL LOW (ref 36.0–46.0)
Hemoglobin: 11.9 g/dL — ABNORMAL LOW (ref 12.0–15.0)
MCH: 28.3 pg (ref 26.0–34.0)
MCHC: 33.5 g/dL (ref 30.0–36.0)
MCV: 84.5 fL (ref 78.0–100.0)
Platelets: 324 10*3/uL (ref 150–400)
RBC: 4.2 MIL/uL (ref 3.87–5.11)
RDW: 14.4 % (ref 11.5–15.5)
WBC: 8.9 10*3/uL (ref 4.0–10.5)

## 2013-03-24 LAB — RPR: RPR Ser Ql: NONREACTIVE

## 2013-03-24 MED ORDER — OXYTOCIN 40 UNITS IN LACTATED RINGERS INFUSION - SIMPLE MED: 62.5000 mL/h | INTRAVENOUS | Status: DC | PRN

## 2013-03-24 MED ORDER — IBUPROFEN 600 MG PO TABS
600.0000 mg | ORAL_TABLET | Freq: Four times a day (QID) | ORAL | Status: DC
  Administered 2013-03-25 – 2013-03-26 (×6): 600 mg via ORAL
  Filled 2013-03-24 (×7): qty 1

## 2013-03-24 MED ORDER — FENTANYL 2.5 MCG/ML BUPIVACAINE 1/10 % EPIDURAL INFUSION (WH - ANES)
14.0000 mL/h | INTRAMUSCULAR | Status: DC | PRN
  Filled 2013-03-24: qty 125

## 2013-03-24 MED ORDER — FENTANYL 2.5 MCG/ML BUPIVACAINE 1/10 % EPIDURAL INFUSION (WH - ANES)
INTRAMUSCULAR | Status: DC | PRN
  Administered 2013-03-24: 14 mL/h via EPIDURAL

## 2013-03-24 MED ORDER — OXYCODONE-ACETAMINOPHEN 5-325 MG PO TABS: 1.0000 | ORAL_TABLET | ORAL | Status: DC | PRN

## 2013-03-24 MED ORDER — LACTATED RINGERS IV SOLN
INTRAVENOUS | Status: DC
  Administered 2013-03-24: 125 mL/h via INTRAVENOUS

## 2013-03-24 MED ORDER — ZOLPIDEM TARTRATE 5 MG PO TABS: 5.0000 mg | ORAL_TABLET | Freq: Every evening | ORAL | Status: DC | PRN

## 2013-03-24 MED ORDER — METHYLERGONOVINE MALEATE 0.2 MG PO TABS: 0.2000 mg | ORAL_TABLET | ORAL | Status: DC | PRN

## 2013-03-24 MED ORDER — TERBUTALINE SULFATE 1 MG/ML IJ SOLN: 0.2500 mg | Freq: Once | INTRAMUSCULAR | Status: DC | PRN

## 2013-03-24 MED ORDER — BISACODYL 10 MG RE SUPP: 10.0000 mg | Freq: Every day | RECTAL | Status: DC | PRN

## 2013-03-24 MED ORDER — LANOLIN HYDROUS EX OINT: TOPICAL_OINTMENT | CUTANEOUS | Status: DC | PRN

## 2013-03-24 MED ORDER — LACTATED RINGERS IV SOLN: INTRAVENOUS | Status: DC

## 2013-03-24 MED ORDER — PHENYLEPHRINE 40 MCG/ML (10ML) SYRINGE FOR IV PUSH (FOR BLOOD PRESSURE SUPPORT)
80.0000 ug | PREFILLED_SYRINGE | INTRAVENOUS | Status: DC | PRN
  Filled 2013-03-24: qty 2
  Filled 2013-03-24: qty 10

## 2013-03-24 MED ORDER — OXYTOCIN 40 UNITS IN LACTATED RINGERS INFUSION - SIMPLE MED
62.5000 mL/h | INTRAVENOUS | Status: DC
  Filled 2013-03-24: qty 1000

## 2013-03-24 MED ORDER — ONDANSETRON HCL 4 MG/2ML IJ SOLN: 4.0000 mg | INTRAMUSCULAR | Status: DC | PRN

## 2013-03-24 MED ORDER — SENNOSIDES-DOCUSATE SODIUM 8.6-50 MG PO TABS
2.0000 | ORAL_TABLET | ORAL | Status: DC
  Administered 2013-03-25 (×2): 2 via ORAL
  Filled 2013-03-24 (×2): qty 2

## 2013-03-24 MED ORDER — WITCH HAZEL-GLYCERIN EX PADS: 1.0000 "application " | MEDICATED_PAD | CUTANEOUS | Status: DC | PRN

## 2013-03-24 MED ORDER — DIPHENHYDRAMINE HCL 50 MG/ML IJ SOLN: 12.5000 mg | INTRAMUSCULAR | Status: DC | PRN

## 2013-03-24 MED ORDER — FLEET ENEMA 7-19 GM/118ML RE ENEM: 1.0000 | ENEMA | Freq: Every day | RECTAL | Status: DC | PRN

## 2013-03-24 MED ORDER — ONDANSETRON HCL 4 MG PO TABS: 4.0000 mg | ORAL_TABLET | ORAL | Status: DC | PRN

## 2013-03-24 MED ORDER — PHENYLEPHRINE 40 MCG/ML (10ML) SYRINGE FOR IV PUSH (FOR BLOOD PRESSURE SUPPORT)
80.0000 ug | PREFILLED_SYRINGE | INTRAVENOUS | Status: DC | PRN
  Filled 2013-03-24: qty 2

## 2013-03-24 MED ORDER — TETANUS-DIPHTH-ACELL PERTUSSIS 5-2.5-18.5 LF-MCG/0.5 IM SUSP
0.5000 mL | Freq: Once | INTRAMUSCULAR | Status: AC
  Administered 2013-03-25: 0.5 mL via INTRAMUSCULAR

## 2013-03-24 MED ORDER — METHYLERGONOVINE MALEATE 0.2 MG/ML IJ SOLN: 0.2000 mg | INTRAMUSCULAR | Status: DC | PRN

## 2013-03-24 MED ORDER — DIPHENHYDRAMINE HCL 25 MG PO CAPS
25.0000 mg | ORAL_CAPSULE | Freq: Four times a day (QID) | ORAL | Status: DC | PRN
Start: 2013-03-24 — End: 2013-03-26

## 2013-03-24 MED ORDER — EPHEDRINE 5 MG/ML INJ
10.0000 mg | INTRAVENOUS | Status: DC | PRN
  Filled 2013-03-24: qty 2
  Filled 2013-03-24: qty 4

## 2013-03-24 MED ORDER — PRENATAL MULTIVITAMIN CH: 1.0000 | ORAL_TABLET | Freq: Every day | ORAL | Status: DC

## 2013-03-24 MED ORDER — ACYCLOVIR 400 MG PO TABS
400.0000 mg | ORAL_TABLET | Freq: Three times a day (TID) | ORAL | Status: DC
  Administered 2013-03-24 (×2): 400 mg via ORAL
  Filled 2013-03-24 (×3): qty 1

## 2013-03-24 MED ORDER — LACTATED RINGERS IV SOLN
INTRAVENOUS | Status: DC
  Administered 2013-03-25: 09:00:00 via INTRAVENOUS

## 2013-03-24 MED ORDER — CITRIC ACID-SODIUM CITRATE 334-500 MG/5ML PO SOLN: 30.0000 mL | ORAL | Status: DC | PRN

## 2013-03-24 MED ORDER — LACTATED RINGERS IV SOLN: 500.0000 mL | INTRAVENOUS | Status: DC | PRN

## 2013-03-24 MED ORDER — MEASLES, MUMPS & RUBELLA VAC ~~LOC~~ INJ: 0.5000 mL | INJECTION | Freq: Once | SUBCUTANEOUS | Status: DC

## 2013-03-24 MED ORDER — BENZOCAINE-MENTHOL 20-0.5 % EX AERO: 1.0000 "application " | INHALATION_SPRAY | CUTANEOUS | Status: DC | PRN

## 2013-03-24 MED ORDER — ACETAMINOPHEN 325 MG PO TABS
650.0000 mg | ORAL_TABLET | ORAL | Status: DC | PRN
Start: 2013-03-24 — End: 2013-03-24

## 2013-03-24 MED ORDER — SIMETHICONE 80 MG PO CHEW
80.0000 mg | CHEWABLE_TABLET | ORAL | Status: DC | PRN
  Administered 2013-03-25: 80 mg via ORAL
  Filled 2013-03-24: qty 1

## 2013-03-24 MED ORDER — LIDOCAINE HCL (PF) 1 % IJ SOLN
30.0000 mL | INTRAMUSCULAR | Status: DC | PRN
  Filled 2013-03-24 (×2): qty 30

## 2013-03-24 MED ORDER — OXYTOCIN BOLUS FROM INFUSION
500.0000 mL | INTRAVENOUS | Status: DC
Start: 2013-03-24 — End: 2013-03-24

## 2013-03-24 MED ORDER — LACTATED RINGERS IV SOLN
500.0000 mL | Freq: Once | INTRAVENOUS | Status: AC
  Administered 2013-03-24: 500 mL via INTRAVENOUS

## 2013-03-24 MED ORDER — IBUPROFEN 600 MG PO TABS
600.0000 mg | ORAL_TABLET | Freq: Four times a day (QID) | ORAL | Status: DC | PRN
  Administered 2013-03-24: 600 mg via ORAL
  Filled 2013-03-24: qty 1

## 2013-03-24 MED ORDER — LIDOCAINE HCL (PF) 1 % IJ SOLN
INTRAMUSCULAR | Status: DC | PRN
  Administered 2013-03-24 (×2): 9 mL

## 2013-03-24 MED ORDER — OXYTOCIN 40 UNITS IN LACTATED RINGERS INFUSION - SIMPLE MED
1.0000 m[IU]/min | INTRAVENOUS | Status: DC
  Administered 2013-03-24: 2 m[IU]/min via INTRAVENOUS

## 2013-03-24 MED ORDER — DIBUCAINE 1 % RE OINT: 1.0000 "application " | TOPICAL_OINTMENT | RECTAL | Status: DC | PRN

## 2013-03-24 MED ORDER — FAMOTIDINE 20 MG PO TABS
40.0000 mg | ORAL_TABLET | Freq: Once | ORAL | Status: AC
  Administered 2013-03-25: 40 mg via ORAL
  Filled 2013-03-24: qty 2

## 2013-03-24 MED ORDER — PRENATAL MULTIVITAMIN CH
1.0000 | ORAL_TABLET | Freq: Every day | ORAL | Status: DC
  Administered 2013-03-25 – 2013-03-26 (×2): 1 via ORAL
  Filled 2013-03-24 (×2): qty 1

## 2013-03-24 MED ORDER — ONDANSETRON HCL 4 MG/2ML IJ SOLN: 4.0000 mg | Freq: Four times a day (QID) | INTRAMUSCULAR | Status: DC | PRN

## 2013-03-24 MED ORDER — OXYCODONE-ACETAMINOPHEN 5-325 MG PO TABS
1.0000 | ORAL_TABLET | ORAL | Status: DC | PRN
  Administered 2013-03-25: 2 via ORAL
  Administered 2013-03-26: 1 via ORAL
  Filled 2013-03-24: qty 1
  Filled 2013-03-24: qty 2

## 2013-03-24 MED ORDER — EPHEDRINE 5 MG/ML INJ
10.0000 mg | INTRAVENOUS | Status: DC | PRN
  Filled 2013-03-24: qty 2

## 2013-03-24 MED ORDER — FERROUS SULFATE 325 (65 FE) MG PO TABS
325.0000 mg | ORAL_TABLET | Freq: Two times a day (BID) | ORAL | Status: DC
  Administered 2013-03-25 – 2013-03-26 (×2): 325 mg via ORAL
  Filled 2013-03-24 (×2): qty 1

## 2013-03-24 MED ORDER — METOCLOPRAMIDE HCL 10 MG PO TABS
10.0000 mg | ORAL_TABLET | Freq: Once | ORAL | Status: AC
  Administered 2013-03-25: 10 mg via ORAL
  Filled 2013-03-24: qty 1

## 2013-03-24 NOTE — Progress Notes (Signed)
   Theresa Farrell is a 38 y.o. G3P2002 at [redacted]w[redacted]d  admitted for active labor  Subjective: No pressure  Objective: BP 114/61  Pulse 77  Temp(Src) 97.3 F (36.3 C) (Oral)  Resp 18  Ht 5\' 4"  (1.626 m)  Wt 192 lb (87.091 kg)  BMI 32.94 kg/m2  LMP 07/01/2012    FHT:  FHR: 130 bpm, variability: moderate,  accelerations:  Present,  decelerations:  Absent UC:   regular, every 2 minutes SVE:   Dilation: 9 Effacement (%): 100 Station: -1 Exam by:: Manus Gunning Cresenzo-Dishmon CNM Pitocin @ 2 mu/min  Labs: Lab Results  Component Value Date   WBC 8.9 03/24/2013   HGB 11.9* 03/24/2013   HCT 35.5* 03/24/2013   MCV 84.5 03/24/2013   PLT 324 03/24/2013   Apparent SROM, clear fluid, sometime between 1730 and now Assessment / Plan: Augmentation of labor, progressing well  Labor: Progressing normally Fetal Wellbeing:  Category I Pain Control:  Epidural Anticipated MOD:  NSVD  CRESENZO-DISHMAN,Delitha Elms 03/24/2013, 6:50 PM

## 2013-03-24 NOTE — Progress Notes (Signed)
Patient ID: [name], female   DOB: 1976/03/09, 38 y.o.   MRN: [id number] Theresa Farrell is a 38 y.o. G3P2002 at [redacted]w[redacted]d admitted for SOOL  Subjective: Comfortable with epidural  Objective: BP 111/69  Pulse 101  Temp(Src) 97.5 F (36.4 C) (Oral)  Resp 20  Ht 5\' 4"  (1.626 m)  Wt 87.091 kg (192 lb)  BMI 32.94 kg/m2  LMP 07/01/2012  Fetal Heart FHR: 150 bpm, variability: moderate,  accelerations:  Present,  decelerations:  Absent   Contractions: q 5-6  SVE:   Dilation: 5 Effacement (%): 100 Station: -1 Exam by:: Dr Sherril Cong 0626: I rechecked cx to evaluate for AROM: Unchanged from my earlier exam and head ballotable Assessment / Plan:  Labor: inadequate UCs in early active phase> pitocin and recheck when UC interval improves Fetal Wellbeing: Cat1 Pain Control:  adequate Expected mode of delivery: NSVD  Theresa Farrell,Theresa Farrell 03/24/2013, 5:16 PM

## 2013-03-24 NOTE — Progress Notes (Addendum)
Patient ID: Theresa Farrell, female   DOB: February 05, 1976, 38 y.o.   MRN: [id number] Theresa Farrell is a 38 y.o. G3P2002 at [redacted]w[redacted]d admitted for active labor  Subjective:   Objective: BP 116/68  Pulse 89  Temp(Src) 97.5 F (36.4 C) (Oral)  Resp 18  Ht 5\' 4"  (1.626 m)  Wt 87.091 kg (192 lb)  BMI 32.94 kg/m2  LMP 07/01/2012      FHT:  FHR: 140 bpm, variability: moderate,  accelerations:  Present,  decelerations:  Absent UC:   regular, every 4-6 minutes SVE:   Dilation: 5 Effacement (%): 100 Station: -1 Exam by:: Dr Sherril Cong  Labs: Lab Results  Component Value Date   WBC 8.9 03/24/2013   HGB 11.9* 03/24/2013   HCT 35.5* 03/24/2013   MCV 84.5 03/24/2013   PLT 324 03/24/2013    Assessment / Plan: Spontaneous labor, progressing normally  Labor: Progressing normally Preeclampsia:  no signs or symptoms of toxicity Fetal Wellbeing:  Category I Pain Control:  Epidural I/D:  n/a Anticipated MOD:  NSVD  [name] 03/24/2013, 4:57 PM  Completed with the assistance of Cherlyn Roberts, MS4  Evaluation and management procedures were performed by Resident physician under my supervision/collaboration. Chart reviewed, patient examined by me and I agree with management and plan.

## 2013-03-24 NOTE — Anesthesia Procedure Notes (Signed)
Epidural Patient location during procedure: OB Start time: 03/24/2013 4:20 PM End time: 03/24/2013 4:24 PM  Staffing Anesthesiologist: Lyn Hollingshead Performed by: anesthesiologist   Preanesthetic Checklist Completed: patient identified, surgical consent, pre-op evaluation, timeout performed, IV checked, risks and benefits discussed and monitors and equipment checked  Epidural Patient position: sitting Prep: site prepped and draped and DuraPrep Patient monitoring: continuous pulse ox and blood pressure Approach: midline Injection technique: LOR air  Needle:  Needle type: Tuohy  Needle gauge: 17 G Needle length: 9 cm and 9 Needle insertion depth: 5 cm cm Catheter type: closed end flexible Catheter size: 19 Gauge Catheter at skin depth: 10 cm Test dose: negative and Other  Assessment Sensory level: T9 Events: blood not aspirated, injection not painful, no injection resistance, negative IV test and no paresthesia  Additional Notes Reason for block:procedure for pain

## 2013-03-24 NOTE — Anesthesia Preprocedure Evaluation (Signed)
Anesthesia Evaluation  Patient identified by MRN, date of birth, ID band Patient awake    Reviewed: Allergy & Precautions, H&P , NPO status , Patient's Chart, lab work & pertinent test results  Airway Mallampati: II TM Distance: >3 FB Neck ROM: full    Dental no notable dental hx.    Pulmonary neg pulmonary ROS,    Pulmonary exam normal       Cardiovascular negative cardio ROS      Neuro/Psych negative neurological ROS  negative psych ROS   GI/Hepatic negative GI ROS, Neg liver ROS,   Endo/Other  negative endocrine ROS  Renal/GU negative Renal ROS     Musculoskeletal   Abdominal Normal abdominal exam  (+)   Peds  Hematology negative hematology ROS (+)   Anesthesia Other Findings   Reproductive/Obstetrics (+) Pregnancy                           Anesthesia Physical Anesthesia Plan  ASA: II  Anesthesia Plan: Epidural   Post-op Pain Management:    Induction:   Airway Management Planned:   Additional Equipment:   Intra-op Plan:   Post-operative Plan:   Informed Consent: I have reviewed the patients History and Physical, chart, labs and discussed the procedure including the risks, benefits and alternatives for the proposed anesthesia with the patient or authorized representative who has indicated his/her understanding and acceptance.     Plan Discussed with:   Anesthesia Plan Comments:         Anesthesia Quick Evaluation  

## 2013-03-24 NOTE — MAU Note (Signed)
C/o uc since her membranes were stripped yesterday @ 5573; c/o bloody show; denies ROM;

## 2013-03-24 NOTE — Progress Notes (Signed)
Pt in high flowlers when rn walked into room.  Pt repositioned self to right lateral

## 2013-03-24 NOTE — H&P (Signed)
Theresa Farrell is a 38 y.o. female at [redacted]w[redacted]d presenting for painful contractions since yesterday when her membranes were stripped in clinic.  Maternal Medical History:  Reason for admission: Nausea.    OB History   Grav Para Term Preterm Abortions TAB SAB Ect Mult Living   3 2 2  0      2     Past Medical History  Diagnosis Date  . Herpes     seropositive   Past Surgical History  Procedure Laterality Date  . Wisdom tooth extraction     Family History: family history includes Cancer (age of onset: 41) in her mother; Hypertension in her mother. Social History:  reports that she has never smoked. She has never used smokeless tobacco. She reports that she does not drink alcohol or use illicit drugs.   Prenatal Transfer Tool  Maternal Diabetes: No Genetic Screening: Normal Maternal Ultrasounds/Referrals: Normal Fetal Ultrasounds or other Referrals:  None Maternal Substance Abuse:  No Significant Maternal Medications:  Meds include: Other: acyclovir Significant Maternal Lab Results:  Lab values include: Other: HSV2+ Other Comments:  None  Review of Systems  Respiratory: Negative for shortness of breath.   Cardiovascular: Negative for chest pain.  Gastrointestinal: Positive for abdominal pain. Negative for nausea, vomiting and diarrhea.  All other systems reviewed and are negative.    Dilation: 4 Effacement (%): 100 Station: -2 Exam by:: D Ayrianna Mcginniss CNM Blood pressure 108/71, pulse 80, temperature 97.5 F (36.4 C), temperature source Oral, resp. rate 18, last menstrual period 07/01/2012. Maternal Exam:  Uterine Assessment: Contraction strength is moderate.  Contraction duration is 60 seconds. Contraction frequency is regular.   Abdomen: Patient reports no abdominal tenderness. Fetal presentation: vertex  Introitus: Normal vulva. Normal vagina.  Ferning test: not done.  Nitrazine test: not done. Amniotic fluid character: not assessed.  Pelvis: adequate for delivery.    Cervix: Cervix evaluated by digital exam.     Fetal Exam Fetal Monitor Review: Mode: ultrasound.   Baseline rate: 150.  Variability: moderate (6-25 bpm).   Pattern: accelerations present and no decelerations.    Fetal State Assessment: Category I - tracings are normal.     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 Chart reviewed, patient examined by me and I agree with management and plan. On prophylactic acyclovir with no lesion or prodrome. 2 hr  Nl: 76-138-129. Wants epidural soon.

## 2013-03-24 NOTE — Discharge Instructions (Signed)

## 2013-03-24 NOTE — H&P (Signed)
Attestation of Attending Supervision of Advanced Practitioner (CNM/NP): Evaluation and management procedures were performed by the Advanced Practitioner under my supervision and collaboration.  I have reviewed the Advanced Practitioner's note and chart, and I agree with the management and plan.  HARRAWAY-SMITH, Danell Vazquez 6:19 PM

## 2013-03-25 ENCOUNTER — Encounter (HOSPITAL_COMMUNITY): Payer: BC Managed Care – PPO | Admitting: Anesthesiology

## 2013-03-25 ENCOUNTER — Inpatient Hospital Stay (HOSPITAL_COMMUNITY): Payer: BC Managed Care – PPO | Admitting: Anesthesiology

## 2013-03-25 ENCOUNTER — Encounter (HOSPITAL_COMMUNITY)
Admission: AD | Disposition: A | Discharge status: Home / Self Care | Payer: Self-pay | Source: Ambulatory Visit | Attending: Obstetrics & Gynecology

## 2013-03-25 DIAGNOSIS — O09529 Supervision of elderly multigravida, unspecified trimester: Secondary | ICD-10-CM

## 2013-03-25 DIAGNOSIS — Z1211 Encounter for screening for malignant neoplasm of colon: Secondary | ICD-10-CM

## 2013-03-25 DIAGNOSIS — Z302 Encounter for sterilization: Secondary | ICD-10-CM

## 2013-03-25 HISTORY — PX: TUBAL LIGATION: SHX77

## 2013-03-25 LAB — MRSA PCR SCREENING: MRSA by PCR: NEGATIVE

## 2013-03-25 LAB — ABO/RH: ABO/RH(D): O POS

## 2013-03-25 SURGERY — LIGATION, FALLOPIAN TUBE, POSTPARTUM
Anesthesia: Epidural | Site: Abdomen

## 2013-03-25 MED ORDER — FENTANYL CITRATE 0.05 MG/ML IJ SOLN
INTRAMUSCULAR | Status: AC
  Filled 2013-03-25: qty 5

## 2013-03-25 MED ORDER — ONDANSETRON HCL 4 MG/2ML IJ SOLN
INTRAMUSCULAR | Status: AC
  Filled 2013-03-25: qty 2

## 2013-03-25 MED ORDER — BUPIVACAINE HCL (PF) 0.25 % IJ SOLN
INTRAMUSCULAR | Status: DC | PRN
  Administered 2013-03-25: 9 mL

## 2013-03-25 MED ORDER — MEPERIDINE HCL 25 MG/ML IJ SOLN: 6.2500 mg | INTRAMUSCULAR | Status: DC | PRN

## 2013-03-25 MED ORDER — LIDOCAINE-EPINEPHRINE (PF) 2 %-1:200000 IJ SOLN
INTRAMUSCULAR | Status: AC
  Filled 2013-03-25: qty 20

## 2013-03-25 MED ORDER — BUPIVACAINE HCL (PF) 0.25 % IJ SOLN
INTRAMUSCULAR | Status: AC
  Filled 2013-03-25: qty 30

## 2013-03-25 MED ORDER — PROMETHAZINE HCL 25 MG/ML IJ SOLN: 6.2500 mg | INTRAMUSCULAR | Status: DC | PRN

## 2013-03-25 MED ORDER — SODIUM BICARBONATE 8.4 % IV SOLN
INTRAVENOUS | Status: DC | PRN
  Administered 2013-03-25: 5 mL via EPIDURAL
  Administered 2013-03-25: 2 mL via EPIDURAL
  Administered 2013-03-25 (×2): 5 mL via EPIDURAL
  Administered 2013-03-25: 3 mL via EPIDURAL

## 2013-03-25 MED ORDER — MIDAZOLAM HCL 2 MG/2ML IJ SOLN
INTRAMUSCULAR | Status: AC
  Filled 2013-03-25: qty 2

## 2013-03-25 MED ORDER — KETOROLAC TROMETHAMINE 30 MG/ML IJ SOLN
INTRAMUSCULAR | Status: AC
Start: 2013-03-25 — End: 2013-03-25
  Filled 2013-03-25: qty 1

## 2013-03-25 MED ORDER — FENTANYL CITRATE 0.05 MG/ML IJ SOLN
INTRAMUSCULAR | Status: DC | PRN
  Administered 2013-03-25: 100 ug via INTRAVENOUS

## 2013-03-25 MED ORDER — ONDANSETRON HCL 4 MG/2ML IJ SOLN
INTRAMUSCULAR | Status: DC | PRN
  Administered 2013-03-25: 4 mg via INTRAVENOUS

## 2013-03-25 MED ORDER — HYDROMORPHONE HCL PF 1 MG/ML IJ SOLN: 0.2500 mg | INTRAMUSCULAR | Status: DC | PRN

## 2013-03-25 MED ORDER — MIDAZOLAM HCL 2 MG/2ML IJ SOLN
INTRAMUSCULAR | Status: DC | PRN
  Administered 2013-03-25 (×2): 1 mg via INTRAVENOUS

## 2013-03-25 MED ORDER — LACTATED RINGERS IV SOLN
INTRAVENOUS | Status: DC | PRN
  Administered 2013-03-25 (×2): via INTRAVENOUS

## 2013-03-25 MED ORDER — SODIUM BICARBONATE 8.4 % IV SOLN
INTRAVENOUS | Status: AC
  Filled 2013-03-25: qty 50

## 2013-03-25 MED ORDER — KETOROLAC TROMETHAMINE 30 MG/ML IJ SOLN: 15.0000 mg | Freq: Once | INTRAMUSCULAR | Status: AC | PRN

## 2013-03-25 SURGICAL SUPPLY — 20 items
BLADE SURG 11 STRL SS (BLADE) ×2 IMPLANT
CHLORAPREP W/TINT 26ML (MISCELLANEOUS) ×2 IMPLANT
CLIP FILSHIE TUBAL LIGA STRL (Clip) ×2 IMPLANT
CLOTH BEACON ORANGE TIMEOUT ST (SAFETY) ×2 IMPLANT
DRSG COVADERM PLUS 2X2 (GAUZE/BANDAGES/DRESSINGS) ×2 IMPLANT
GLOVE BIO SURGEON STRL SZ 6.5 (GLOVE) ×2 IMPLANT
GLOVE BIOGEL PI IND STRL 7.0 (GLOVE) ×1 IMPLANT
GLOVE BIOGEL PI INDICATOR 7.0 (GLOVE) ×1
GOWN PREVENTION PLUS LG XLONG (DISPOSABLE) ×4 IMPLANT
NEEDLE HYPO 25X1 1.5 SAFETY (NEEDLE) ×2 IMPLANT
NS IRRIG 1000ML POUR BTL (IV SOLUTION) ×2 IMPLANT
PACK ABDOMINAL MINOR (CUSTOM PROCEDURE TRAY) ×2 IMPLANT
SPONGE LAP 4X18 X RAY DECT (DISPOSABLE) IMPLANT
SUT VIC AB 0 CT1 27 (SUTURE) ×1
SUT VIC AB 0 CT1 27XBRD ANBCTR (SUTURE) ×1 IMPLANT
SUT VICRYL 4-0 PS2 18IN ABS (SUTURE) ×2 IMPLANT
SYR CONTROL 10ML LL (SYRINGE) ×2 IMPLANT
TOWEL OR 17X24 6PK STRL BLUE (TOWEL DISPOSABLE) ×4 IMPLANT
TRAY FOLEY BAG SILVER LF 14FR (CATHETERS) ×2 IMPLANT
WATER STERILE IRR 1000ML POUR (IV SOLUTION) ×2 IMPLANT

## 2013-03-25 NOTE — Anesthesia Postprocedure Evaluation (Signed)
  Anesthesia Post-op Note  Anesthesia Post Note  Patient: Theresa Farrell  Procedure(s) Performed: * No procedures listed *  Anesthesia type: Epidural  Patient location: Mother/Baby  Post pain: Pain level controlled  Post assessment: Post-op Vital signs reviewed  Last Vitals:  Filed Vitals:   03/25/13 0400  BP: 101/66  Pulse: 88  Temp: 36.4 C  Resp: 18    Post vital signs: Reviewed  Level of consciousness:alert  Complications: No apparent anesthesia complications

## 2013-03-25 NOTE — Anesthesia Postprocedure Evaluation (Signed)
Anesthesia Post Note  Patient: Theresa Farrell  Procedure(s) Performed: Procedure(s) (LRB): POST PARTUM TUBAL LIGATION (N/A)  Anesthesia type: Epidural  Patient location: PACU  Post pain: Pain level controlled  Post assessment: Post-op Vital signs reviewed  Last Vitals:  Filed Vitals:   03/25/13 1100  BP: 99/50  Pulse: 80  Temp: 36.8 C  Resp: 13    Post vital signs: Reviewed  Level of consciousness: awake  Complications: No apparent anesthesia complications

## 2013-03-25 NOTE — Op Note (Signed)
Theresa Farrell [place] 03/24/2013 - 03/25/2013  PREOPERATIVE DIAGNOSIS:  Multiparity, undesired fertility  POSTOPERATIVE DIAGNOSIS:  Multiparity, undesired fertility  PROCEDURE:  Postpartum Bilateral Tubal Sterilization using Filshie Clips   ANESTHESIA:  Epidural  COMPLICATIONS:  None immediate.  ESTIMATED BLOOD LOSS:  Less than 20 ml.  FLUIDS: 1000 ml LR.  URINE OUTPUT:  30 ml of clear urine.  INDICATIONS: 38 y.o. [id number]  with undesired fertility,status post vaginal delivery, desires permanent sterilization. Risks and benefits of procedure discussed with patient including permanence of method, bleeding, infection, injury to surrounding organs and need for additional procedures. Risk failure of 0.5-1% with increased risk of ectopic gestation if pregnancy occurs was also discussed with patient.   FINDINGS:  Normal uterus, tubes, and ovaries.  TECHNIQUE:  The patient was taken to the operating room where her epidural anesthesia was dosed up to surgical level and found to be adequate.  She was then placed in the dorsal supine position and prepped and draped in sterile fashion.  After an adequate timeout was performed, attention was turned to the patient's abdomen where a small transverse skin incision was made under the umbilical fold. The incision was taken down to the layer of fascia using the scalpel, and fascia was incised, and extended bilaterally using Mayo scissors. The peritoneum was entered in a sharp fashion. Attention was then turned to the patient's uterus, and left fallopian tube was identified and followed out to the fimbriated end.  A Filshie clip was placed on the left fallopian tube about 2 cm from the cornual attachment, with care given to incorporate the underlying mesosalpinx.  A similar process was carried out on the right side allowing for bilateral tubal sterilization.  Good hemostasis was noted overall.  Local analgesia was injected at both operative sites.The instruments were then  removed from the patient's abdomen and the fascial incision was repaired with 0 Vicryl, and the skin was closed with a 4-0 Vicryl subcuticular stitch. The patient tolerated the procedure well.  Sponge, lap, and needle counts were correct times two.  The patient was then taken to the recovery room awake, extubated and in stable condition.  Woodroe Mode, MD 03/25/2013 11:00 AM

## 2013-03-25 NOTE — Progress Notes (Addendum)
Post Partum Day 1 Subjective: no complaints, up ad lib, voiding and tolerating PO, small lochia, plans to breastfeed, bilateral tubal ligation  Scheduled for 1100  Objective: Blood pressure 101/66, pulse 88, temperature 97.6 F (36.4 C), temperature source Oral, resp. rate 18, height 5\' 4"  (1.626 m), weight 87.091 kg (192 lb), last menstrual period 07/01/2012, unknown if currently breastfeeding.  Physical Exam:  General: alert, cooperative and no distress Lochia:normal flow Chest: CTAB Heart: RRR no m/r/g Abdomen: +BS, soft, nontender,  Uterine Fundus: firm DVT Evaluation: No evidence of DVT seen on physical exam. Extremities: no edema   Recent Labs  03/24/13 1500  HGB 11.9*  HCT 35.5*    Assessment/Plan: Plan for discharge tomorrow and Lactation consult   LOS: 1 day   CRESENZO-DISHMAN,FRANCES 03/25/2013, 7:24 AM   Scheduled for PP BTL. The procedure and the risk of anesthesia, bleeding, infection, bowel and bladder injury, failure (1/200) and ectopic pregnancy were discussed and her questions were answered. The procedure will be performed this morning  Woodroe Mode, MD 03/25/2013 9:19 AM

## 2013-03-25 NOTE — Lactation Note (Signed)
This note was copied from the chart of Boy Novelle Knapke. Lactation Consultation Note Initial consultation;  Mom experienced with breast feeding. Mom states breast feeding is going well, is concerned that she does not have any milk and has not heard swallows. Offered to assist with a feeding, mom accepts. With mom's permission, performed hand expression with large drops colostrum. Assisted to latch baby to breast, mom comfortable, baby with rhythmic suck and audible swallow. Mom reassured. Reviewed baby and me breast feeding basics, reviewed lactation brochure, community resources, and BFSG.  Patient Name: Delaina Fetsch OMVEH'M Date: 03/25/2013 Reason for consult: Initial assessment   Maternal Data Formula Feeding for Exclusion: No Has patient been taught Hand Expression?: Yes Does the patient have breastfeeding experience prior to this delivery?: Yes  Feeding Feeding Type: Breast Fed Length of feed: 15 min  LATCH Score/Interventions Latch: Grasps breast easily, tongue down, lips flanged, rhythmical sucking.  Audible Swallowing: Spontaneous and intermittent  Type of Nipple: Everted at rest and after stimulation  Comfort (Breast/Nipple): Soft / non-tender     Hold (Positioning): Assistance needed to correctly position infant at breast and maintain latch.  LATCH Score: 9  Lactation Tools Discussed/Used     Consult Status Consult Status: PRN    Dorise Bullion 03/25/2013, 3:51 PM

## 2013-03-25 NOTE — Transfer of Care (Signed)
Immediate Anesthesia Transfer of Care Note  Patient: Theresa Farrell  Procedure(s) Performed: Procedure(s): POST PARTUM TUBAL LIGATION (N/A)  Patient Location: PACU  Anesthesia Type:Spinal  Level of Consciousness: awake, alert , oriented and patient cooperative  Airway & Oxygen Therapy: Patient Spontanous Breathing  Post-op Assessment: Report given to PACU RN and Post -op Vital signs reviewed and stable  Post vital signs: stable  Complications: No apparent anesthesia complications

## 2013-03-25 NOTE — Anesthesia Preprocedure Evaluation (Signed)
Anesthesia Evaluation  Patient identified by MRN, date of birth, ID band Patient awake    Reviewed: Allergy & Precautions, H&P , NPO status , Patient's Chart, lab work & pertinent test results  Airway Mallampati: II TM Distance: >3 FB Neck ROM: full    Dental no notable dental hx.    Pulmonary neg pulmonary ROS,    Pulmonary exam normal       Cardiovascular negative cardio ROS      Neuro/Psych negative neurological ROS  negative psych ROS   GI/Hepatic negative GI ROS, Neg liver ROS,   Endo/Other  negative endocrine ROS  Renal/GU negative Renal ROS     Musculoskeletal   Abdominal Normal abdominal exam  (+)   Peds  Hematology negative hematology ROS (+)   Anesthesia Other Findings   Reproductive/Obstetrics (+) Pregnancy                           Anesthesia Physical  Anesthesia Plan  ASA: II  Anesthesia Plan: Epidural   Post-op Pain Management:    Induction:   Airway Management Planned:   Additional Equipment:   Intra-op Plan:   Post-operative Plan:   Informed Consent: I have reviewed the patients History and Physical, chart, labs and discussed the procedure including the risks, benefits and alternatives for the proposed anesthesia with the patient or authorized representative who has indicated his/her understanding and acceptance.     Plan Discussed with:   Anesthesia Plan Comments: (For PPTL with epidural in-situ from labor.)        Anesthesia Quick Evaluation

## 2013-03-26 MED ORDER — OXYCODONE-ACETAMINOPHEN 5-325 MG PO TABS: 1.0000 | ORAL_TABLET | ORAL | Status: DC | PRN

## 2013-03-26 MED ORDER — IBUPROFEN 600 MG PO TABS: 600.0000 mg | ORAL_TABLET | Freq: Four times a day (QID) | ORAL | Status: DC

## 2013-03-26 NOTE — Discharge Summary (Signed)
Physician Discharge Summary  Patient ID: Theresa Farrell MRN: [id number] DOB/AGE: 06/24/1975 38 y.o.  Admit date: 03/24/2013 Discharge date: 03/26/2013  Admission Diagnoses: Uterine Contractions consistent with onset of labor after membrane stripping   Discharge Diagnoses: Post NVD and [place] Hospital Course: Theresa Farrell was admitted 08/Jan/2015 for painful uterine contractions consistent with onset of labor. She had a normal course of labor augmented with Pitocin. Pain relief was achieved with epidural. At 8.18 pm, delivered a female singleton fetus 7 pounds 2 ounces with no complications. Underwent a bilateral tubal ligation on 09/Jan/2015. No complications. Patient is doing well and is ready for discharge CBC    Component Value Date/Time   WBC 8.9 03/24/2013 1500   RBC 4.20 03/24/2013 1500   HGB 11.9* 03/24/2013 1500   HCT 35.5* 03/24/2013 1500   PLT 324 03/24/2013 1500   MCV 84.5 03/24/2013 1500   MCH 28.3 03/24/2013 1500   MCHC 33.5 03/24/2013 1500   RDW 14.4 03/24/2013 1500     Discharge Exam: Blood pressure 119/55, pulse 75, temperature 98.5 F (36.9 C), temperature source Oral, resp. rate 18, height 5\' 4"  (1.626 m), weight 87.091 kg (192 lb), last menstrual period 07/01/2012, SpO2 100.00%, unknown if currently breastfeeding. General appearance: alert and cooperative GI: Soft and lax abdomen. Firm Uterus, Fundus 2 below umbilicus Incision/Wound: Clean Dressing, no discharge, no tenderness  Disposition: 01-Home or Self Care   Future Appointments Provider Department Dept Phone   03/30/2013 8:45 AM Tawnya Crook, CNM [facility] [phone]       Medication List    STOP taking these medications       acyclovir 400 MG tablet  Commonly known as:  ZOVIRAX      TAKE these medications       ibuprofen 600 MG tablet  Commonly known as:  ADVIL,MOTRIN  Take 1 tablet (600 mg total) by mouth every 6 (six) hours.     oxyCODONE-acetaminophen 5-325 MG per tablet  Commonly  known as:  PERCOCET/ROXICET  Take 1-2 tablets by mouth every 4 (four) hours as needed for severe pain (moderate - severe pain).     prenatal multivitamin Tabs tablet  Take 1 tablet by mouth daily at 12 noon.           Follow-up Information   Follow up with [facility]. (Keep scheduled appointment)    Contact information:   [place] [id number] [phone]      Signed: Cherlyn Farrell 03/26/2013, 7:43 AM  I have seen and examined this patient and I agree with the above. Breastfeeding is going well. Theresa Farrell 9:12 AM 03/26/2013

## 2013-03-28 ENCOUNTER — Encounter (HOSPITAL_COMMUNITY): Payer: Self-pay | Admitting: Obstetrics & Gynecology

## 2013-03-29 DIAGNOSIS — Z029 Encounter for administrative examinations, unspecified: Secondary | ICD-10-CM

## 2013-03-30 ENCOUNTER — Encounter: Payer: BC Managed Care – PPO | Admitting: Women's Health

## 2013-04-24 ENCOUNTER — Emergency Department (HOSPITAL_COMMUNITY)
Admission: EM | Admit: 2013-04-24 | Discharge: 2013-04-25 | Disposition: A | Discharge status: Home / Self Care | Payer: BC Managed Care – PPO | Attending: Emergency Medicine | Admitting: Emergency Medicine

## 2013-04-24 DIAGNOSIS — M25519 Pain in unspecified shoulder: Secondary | ICD-10-CM | POA: Insufficient documentation

## 2013-04-24 DIAGNOSIS — Z880 Allergy status to penicillin: Secondary | ICD-10-CM | POA: Insufficient documentation

## 2013-04-24 DIAGNOSIS — R61 Generalized hyperhidrosis: Secondary | ICD-10-CM | POA: Insufficient documentation

## 2013-04-24 DIAGNOSIS — R1031 Right lower quadrant pain: Secondary | ICD-10-CM | POA: Insufficient documentation

## 2013-04-24 DIAGNOSIS — R079 Chest pain, unspecified: Secondary | ICD-10-CM

## 2013-04-24 DIAGNOSIS — Z8619 Personal history of other infectious and parasitic diseases: Secondary | ICD-10-CM | POA: Insufficient documentation

## 2013-04-24 DIAGNOSIS — M899 Disorder of bone, unspecified: Secondary | ICD-10-CM | POA: Insufficient documentation

## 2013-04-24 DIAGNOSIS — O26899 Other specified pregnancy related conditions, unspecified trimester: Secondary | ICD-10-CM | POA: Insufficient documentation

## 2013-04-24 DIAGNOSIS — O909 Complication of the puerperium, unspecified: Principal | ICD-10-CM

## 2013-04-24 DIAGNOSIS — M539 Dorsopathy, unspecified: Secondary | ICD-10-CM

## 2013-04-24 DIAGNOSIS — O9989 Other specified diseases and conditions complicating pregnancy, childbirth and the puerperium: Secondary | ICD-10-CM

## 2013-04-24 DIAGNOSIS — M259 Joint disorder, unspecified: Secondary | ICD-10-CM | POA: Insufficient documentation

## 2013-04-24 DIAGNOSIS — Z79899 Other long term (current) drug therapy: Secondary | ICD-10-CM | POA: Insufficient documentation

## 2013-04-24 DIAGNOSIS — R002 Palpitations: Secondary | ICD-10-CM | POA: Insufficient documentation

## 2013-04-24 DIAGNOSIS — R0789 Other chest pain: Secondary | ICD-10-CM | POA: Insufficient documentation

## 2013-04-24 DIAGNOSIS — R209 Unspecified disturbances of skin sensation: Secondary | ICD-10-CM | POA: Insufficient documentation

## 2013-04-24 DIAGNOSIS — R0602 Shortness of breath: Secondary | ICD-10-CM | POA: Insufficient documentation

## 2013-04-24 DIAGNOSIS — R109 Unspecified abdominal pain: Secondary | ICD-10-CM

## 2013-04-24 NOTE — ED Provider Notes (Signed)
CSN: [id number]     Arrival date & time 04/24/13  2319 History  This chart was scribed for Theresa Acosta, MD by Zettie Pho, ED Scribe. This patient was seen in room APA10/[facility]10 and the patient's care was started at 12:13 AM.    Chief Complaint  Patient presents with  . Chest Pain    onset was 20 minutes again while bathing child  . Shortness of Breath  . Abdominal Pain    right sided abdominal pain    The history is provided by the patient. No language interpreter was used.   HPI Comments: Theresa Farrell is a 38 y.o. female who presents to the Emergency Department complaining of a constant, sharp pain to the RLQ of the abdomen with sudden onset about an hour ago after standing to bathe her child. She states that the pain seemed to radiate to her chest, described as a tightness, and left shoulder. She reports several days of right flank discomfort prior to this which was intermittent as well. She reports some associated shortness of breath, diaphoresis, and palpitations. She states that her pain and other symptoms have been gradually improving.  She denies visual disturbance, headache, cough, congestion, nausea, emesis, diarrhea, dysuria. Patient gave birth vaginally on 03/24/2013 without immediate complication, but had a tubal ligation the next day. Patient states that she has had some spotting since the surgery, which has been gradually improving. Patient denies history of any other abdominal surgeries. She states she is currently breast-feeding. Patient states that she recently started a course of clindamycin for a tooth extraction that she has scheduled in a few days. She denies history of cardiac problems or blood clots. Patient has allergies to penicillins. Patient has no other pertinent medical history.   Past Medical History  Diagnosis Date  . Herpes     seropositive   Past Surgical History  Procedure Laterality Date  . Wisdom tooth extraction    . Tubal ligation N/A 03/25/2013     Procedure: POST PARTUM TUBAL LIGATION;  Surgeon: Woodroe Mode, MD;  Location: [facility];  Service: Gynecology;  Laterality: N/A;   Family History  Problem Relation Age of Onset  . Cancer Mother 52    breast  . Hypertension Mother    History  Substance Use Topics  . Smoking status: Never Smoker   . Smokeless tobacco: Never Used  . Alcohol Use: No   OB History   Grav Para Term Preterm Abortions TAB SAB Ect Mult Living   3 3 3  0      3     Review of Systems  Constitutional: Positive for diaphoresis.  HENT: Negative for congestion.   Eyes: Negative for visual disturbance.  Respiratory: Positive for chest tightness and shortness of breath. Negative for cough.   Cardiovascular: Positive for palpitations.  Gastrointestinal: Positive for abdominal pain. Negative for nausea, vomiting and diarrhea.  Genitourinary: Negative for dysuria.  Musculoskeletal: Positive for arthralgias (left shoulder). Negative for gait problem.  Neurological: Negative for weakness and headaches.  All other systems reviewed and are negative.    Allergies  Amoxicillin and Penicillins  Home Medications   Current Outpatient Rx  Name  Route  Sig  Dispense  Refill  . HYDROcodone-acetaminophen (NORCO/VICODIN) 5-325 MG per tablet   Oral   Take 1 tablet by mouth every 6 (six) hours as needed for moderate pain.         Marland Kitchen ibuprofen (ADVIL,MOTRIN) 600 MG tablet   Oral   Take  1 tablet (600 mg total) by mouth every 6 (six) hours.   50 tablet   1   . oxyCODONE-acetaminophen (PERCOCET/ROXICET) 5-325 MG per tablet   Oral   Take 1-2 tablets by mouth every 4 (four) hours as needed for severe pain (moderate - severe pain).   20 tablet   0   . Prenatal Vit-Fe Fumarate-FA (PRENATAL MULTIVITAMIN) TABS tablet   Oral   Take 1 tablet by mouth daily at 12 noon.          Triage Vitals: BP 134/69  Pulse 85  Temp(Src) 97.5 F (36.4 C) (Oral)  Resp 20  Ht 5\' 4"  (1.626 m)  Wt 174 lb (78.926 kg)  BMI 29.85 kg/m2   SpO2 100%  Physical Exam  Nursing note and vitals reviewed. Constitutional: She is oriented to person, place, and time. She appears well-developed and well-nourished. No distress.  HENT:  Head: Normocephalic and atraumatic.  Right Ear: Hearing, tympanic membrane, external ear and ear canal normal.  Left Ear: Hearing, tympanic membrane, external ear and ear canal normal.  Eyes: Conjunctivae and EOM are normal. Pupils are equal, round, and reactive to light.  Neck: Normal range of motion. Neck supple.  Cardiovascular: Normal rate, regular rhythm, normal heart sounds and intact distal pulses.   No murmur heard. Pulmonary/Chest: Effort normal and breath sounds normal. No respiratory distress.  Abdominal: Soft. Bowel sounds are normal. She exhibits no distension. There is tenderness.  Tenderness to palpation to the bilateral lower quadrants, worse on the right and minimal on the left. No peritoneal signs.   Musculoskeletal: Normal range of motion. She exhibits no edema.  No peripheral edema or swelling.   Neurological: She is alert and oriented to person, place, and time. No cranial nerve deficit.  Subjective paresthesias to the left arm and left leg.  Speech is clear, cranial nerves III through XII are intact, memory is intact, strength is normal in all 4 extremities including grips. Coordination as tested by finger-nose-finger is normal, no limb ataxia. Normal gait, normal reflexes at the patellar tendons bilaterally  Skin: Skin is warm and dry.  Psychiatric: She has a normal mood and affect. Her behavior is normal.    ED Course  Procedures (including critical care time)  DIAGNOSTIC STUDIES: Oxygen Saturation is 100% on room air, normal by my interpretation.    COORDINATION OF CARE: 12:28 PM- Ordered a chest x-ray, blood labs, and EKG. Performed a limited bedside ultrasound of the abdomen, which revealed possible kidney stones. Will order a CT of the abdomen to further delineate.  Offered patient pain medication, but she states it is unnecessary at this time. Discussed treatment plan with patient at bedside and patient verbalized agreement.     Labs Review Labs Reviewed  BASIC METABOLIC PANEL - Abnormal; Notable for the following:    Potassium 3.5 (*)    All other components within normal limits  URINALYSIS, ROUTINE W REFLEX MICROSCOPIC - Abnormal; Notable for the following:    Hgb urine dipstick SMALL (*)    Leukocytes, UA TRACE (*)    All other components within normal limits  URINE MICROSCOPIC-ADD ON - Abnormal; Notable for the following:    Squamous Epithelial / LPF FEW (*)    All other components within normal limits  CBC  PRO B NATRIURETIC PEPTIDE  TROPONIN I   Imaging Review Ct Abdomen Pelvis Wo Contrast  04/25/2013   CLINICAL DATA:  Right flank pain  EXAM: CT ABDOMEN AND PELVIS WITHOUT CONTRAST  TECHNIQUE:  Multidetector CT imaging of the abdomen and pelvis was performed following the standard protocol without intravenous contrast.  COMPARISON:  None.  FINDINGS: The liver, spleen, pancreas, gallbladder, adrenal glands and kidneys are normal. There is no nephrolithiasis or hydroureteronephrosis bilaterally. The aorta is normal. There is no abdominal lymphadenopathy. There is no small bowel obstruction or diverticulitis. Bowel content is noted throughout colon. The appendix is normal.  The bladder is decompressed limiting evaluation. The uterus is normal. Bilateral tubal ligation clips are noted. The lung bases are clear. No acute abnormalities identified within the visualized bones.  IMPRESSION: No acute abnormality identified in the abdomen and pelvis. There is no nephrolithiasis or hydroureteronephrosis bilaterally. The appendix normal. Constipation.   Electronically Signed   By: Abelardo Diesel M.D.   On: 04/25/2013 01:14   Dg Chest 2 View  04/25/2013   CLINICAL DATA:  Left shoulder and left-sided chest pain for 1 day. Right-sided flank pain.  EXAM: CHEST  2 VIEW   COMPARISON:  None.  FINDINGS: Cardiopericardial silhouette within normal limits. Mediastinal contours normal. Trachea midline. No airspace disease or effusion. Monitoring leads project over the chest.  IMPRESSION: No active cardiopulmonary disease.   Electronically Signed   By: Dereck Ligas M.D.   On: 04/25/2013 01:19    EKG Interpretation    Date/Time:  Sunday April 24 2013 23:33:37 EST Ventricular Rate:  71 PR Interval:  156 QRS Duration: 78 QT Interval:  378 QTC Calculation: 410 R Axis:   49 Text Interpretation:  Normal sinus rhythm with sinus arrhythmia Low voltage QRS Nonspecific T wave abnormality Abnormal ECG No previous ECGs available Confirmed by Brinna Divelbiss  MD, Juliyah Mergen ([id number]) on 04/25/2013 12:50:30 AM            MDM   1. Abdominal pain   2. Chest pain    The patient's symptoms have improved significantly spontaneously since arrival. She has minimal right lower quadrant tenderness and thankfully a CT scan that shows no signs of appendicitis or signs of acute kidney stones. She may have very well passed the stone into her bladder, her urine shows no signs of infection, no leukocytosis, normal chest x-ray troponin and EKG. At this time it appears that the patient is stable for discharge. Doubt other more significant cause of the patient's abdominal pain, discussed with patient the indications for returning she has expressed her understanding.  She has no chest pain at this time, no tachycardia, no hypoxia, no peripheral edema to suggest pulmonary embolism  Meds given in ED:  Medications  ketorolac (TORADOL) 30 MG/ML injection 30 mg (not administered)    New Prescriptions   No medications on file   I personally performed the services described in this documentation, which was scribed in my presence. The recorded information has been reviewed and is accurate.       Theresa Acosta, MD 04/25/13 [phone]

## 2013-04-25 ENCOUNTER — Emergency Department (HOSPITAL_COMMUNITY): Payer: BC Managed Care – PPO

## 2013-04-25 LAB — BASIC METABOLIC PANEL
BUN: 10 mg/dL (ref 6–23)
CO2: 25 mEq/L (ref 19–32)
Calcium: 9.2 mg/dL (ref 8.4–10.5)
Chloride: 100 mEq/L (ref 96–112)
Creatinine, Ser: 0.78 mg/dL (ref 0.50–1.10)
GFR calc Af Amer: >90 mL/min (ref >90)
GFR calc non Af Amer: >90 mL/min (ref >90)
Glucose, Bld: 94 mg/dL (ref 70–99)
Potassium: 3.5 mEq/L — ABNORMAL LOW (ref 3.7–5.3)
Sodium: 138 mEq/L (ref 137–147)

## 2013-04-25 LAB — URINALYSIS, ROUTINE W REFLEX MICROSCOPIC
Bilirubin Urine: NEGATIVE
Glucose, UA: NEGATIVE mg/dL
Ketones, ur: NEGATIVE mg/dL
Nitrite: NEGATIVE
Protein, ur: NEGATIVE mg/dL
Specific Gravity, Urine: 1.02 (ref 1.005–1.030)
Urobilinogen, UA: 1 mg/dL (ref 0.0–1.0)
pH: 6.5 (ref 5.0–8.0)

## 2013-04-25 LAB — CBC
HCT: 39.5 % (ref 36.0–46.0)
Hemoglobin: 13.4 g/dL (ref 12.0–15.0)
MCH: 28.7 pg (ref 26.0–34.0)
MCHC: 33.9 g/dL (ref 30.0–36.0)
MCV: 84.6 fL (ref 78.0–100.0)
Platelets: 354 10*3/uL (ref 150–400)
RBC: 4.67 MIL/uL (ref 3.87–5.11)
RDW: 12.4 % (ref 11.5–15.5)
WBC: 7.8 10*3/uL (ref 4.0–10.5)

## 2013-04-25 LAB — URINE MICROSCOPIC-ADD ON

## 2013-04-25 LAB — PRO B NATRIURETIC PEPTIDE: Pro B Natriuretic peptide (BNP): 33.3 pg/mL (ref 0–125)

## 2013-04-25 LAB — TROPONIN I: Troponin I: <0.3 ng/mL (ref <0.30)

## 2013-04-25 MED ORDER — KETOROLAC TROMETHAMINE 30 MG/ML IJ SOLN
30.0000 mg | Freq: Once | INTRAMUSCULAR | Status: AC
  Administered 2013-04-25: 30 mg via INTRAVENOUS
  Filled 2013-04-25: qty 1

## 2013-04-25 NOTE — Discharge Instructions (Signed)
Your presentation is consistent with a small kidney stone. This appears to have passed which is also why your symptoms have eased off. The CAT scan shows no signs of kidney problems, no signs of appendicitis. Please return to the hospital he been medially for severe or worsening symptoms including abdominal pain, back pain or vomiting or fevers.  You may use the medication as prescribed by your doctor for pain, please call your doctor in the morning for a recheck within the next 48 hours.  Please call your doctor for a followup appointment within 24-48 hours. When you talk to your doctor please let them know that you were seen in the emergency department and have them acquire all of your records so that they can discuss the findings with you and formulate a treatment plan to fully care for your new and ongoing problems.

## 2013-05-02 ENCOUNTER — Encounter: Payer: Self-pay | Admitting: Women's Health

## 2013-05-02 ENCOUNTER — Ambulatory Visit (INDEPENDENT_AMBULATORY_CARE_PROVIDER_SITE_OTHER): Payer: BC Managed Care – PPO | Admitting: Women's Health

## 2013-05-02 VITALS — BP 112/70 | Ht 64.0 in | Wt 176.5 lb

## 2013-05-02 DIAGNOSIS — R1031 Right lower quadrant pain: Secondary | ICD-10-CM

## 2013-05-02 DIAGNOSIS — R1033 Periumbilical pain: Secondary | ICD-10-CM

## 2013-05-02 DIAGNOSIS — R768 Other specified abnormal immunological findings in serum: Secondary | ICD-10-CM

## 2013-05-02 DIAGNOSIS — R109 Unspecified abdominal pain: Secondary | ICD-10-CM

## 2013-05-02 DIAGNOSIS — R894 Abnormal immunological findings in specimens from other organs, systems and tissues: Secondary | ICD-10-CM

## 2013-05-02 DIAGNOSIS — M792 Neuralgia and neuritis, unspecified: Secondary | ICD-10-CM | POA: Insufficient documentation

## 2013-05-02 DIAGNOSIS — IMO0002 Reserved for concepts with insufficient information to code with codable children: Secondary | ICD-10-CM

## 2013-05-02 NOTE — Progress Notes (Signed)
Patient ID: Theresa Farrell, female   DOB: [birth year], 38 y.o.   MRN: [id number]   [facility] Visit  Patient name: Theresa Farrell MRN [id number]  Date of birth: Oct 12, 1975  CC & HPI:  Theresa Farrell is a 38 y.o. African American female 5wks s/p SVD and pp BTL presenting today for abdominal pain. States she went to [facility] 2/9 w/ severe Rt lower & upper abd pain, CT was neg- was told she may have had a kidney stone, and the pain went away. She woke up the next morning w/ constant severe burning periumbilical/RLQ pain-that even hurt when her clothes would touch skin. Denies fever/chills, n/v/d.   Pertinent History Reviewed:  Medical & Surgical Hx:   Past Medical History  Diagnosis Date  . Herpes     seropositive   Past Surgical History  Procedure Laterality Date  . Wisdom tooth extraction    . Tubal ligation N/A 03/25/2013    Procedure: POST PARTUM TUBAL LIGATION;  Surgeon: Woodroe Mode, MD;  Location: [facility];  Service: Gynecology;  Laterality: N/A;   Medications: Reviewed & Updated - see associated section Social History: Reviewed -  reports that she has never smoked. She has never used smokeless tobacco.  Objective Findings:  Vitals: BP 112/70  Ht 5\' 4"  (1.626 m)  Wt 176 lb 8 oz (80.06 kg)  BMI 30.28 kg/m2  Breastfeeding? No  Physical Examination: General appearance - alert, well appearing, and in no distress Abdomen - bs normal x 4, abd soft, no erythema/edema. Very tender to light and deep palpation over umbilicus and to Rt of umbilicus. Co-exam w/ LHE. Pt pointed to Rt of umbilicus as main source of pain. Area injected w/ 10cc marcaine by LHE w/ complete resolution of pain after ~10-26mins. Felt to be nerve pain.  Assessment & Plan:  A:   5wks s/p SVD and pp BTL w/ burning abd pain  Nerve pain possibly from BTL- resolved s/p marcaine injection P:   F/U 1wk as scheduled for pp visit- will switch to be w/ LHE in case she needs another injection   Tawnya Crook CNM, [facility] 05/02/2013 4:21 PM

## 2013-05-09 ENCOUNTER — Ambulatory Visit: Payer: BC Managed Care – PPO | Admitting: Obstetrics & Gynecology

## 2013-05-09 ENCOUNTER — Ambulatory Visit: Payer: BC Managed Care – PPO | Admitting: Women's Health

## 2013-05-10 ENCOUNTER — Ambulatory Visit: Payer: BC Managed Care – PPO | Admitting: Obstetrics & Gynecology

## 2013-05-11 ENCOUNTER — Ambulatory Visit: Payer: BC Managed Care – PPO | Admitting: Obstetrics & Gynecology

## 2013-05-17 ENCOUNTER — Encounter: Payer: Self-pay | Admitting: Obstetrics & Gynecology

## 2013-05-17 ENCOUNTER — Ambulatory Visit (INDEPENDENT_AMBULATORY_CARE_PROVIDER_SITE_OTHER): Payer: BC Managed Care – PPO | Admitting: Obstetrics & Gynecology

## 2013-05-17 NOTE — Progress Notes (Signed)
Patient ID: Theresa Farrell, female   DOB: 1976/03/15, 38 y.o.   MRN: [id number] Pt is 8 weeks post partum vaginal delivery and post partum tubal ligation No complaints Did have an unusual neurogenic pain in the abdominal wall which I injected and has resolved  Exam Normal involution Normal pp exam  Follow up 6 months

## 2013-06-02 ENCOUNTER — Telehealth: Payer: Self-pay | Admitting: Obstetrics and Gynecology

## 2013-06-02 NOTE — Telephone Encounter (Signed)
Pt states insurance company to fax disability request forms today

## 2014-01-16 ENCOUNTER — Encounter: Payer: Self-pay | Admitting: Obstetrics & Gynecology

## 2015-01-10 ENCOUNTER — Emergency Department (HOSPITAL_COMMUNITY)
Admission: EM | Admit: 2015-01-10 | Discharge: 2015-01-10 | Disposition: A | Discharge status: Home / Self Care | Payer: BC Managed Care – PPO | Attending: Emergency Medicine | Admitting: Emergency Medicine

## 2015-01-10 ENCOUNTER — Emergency Department (HOSPITAL_COMMUNITY): Payer: BC Managed Care – PPO

## 2015-01-10 ENCOUNTER — Encounter (HOSPITAL_COMMUNITY): Payer: Self-pay | Admitting: Emergency Medicine

## 2015-01-10 DIAGNOSIS — R51 Headache: Secondary | ICD-10-CM | POA: Insufficient documentation

## 2015-01-10 DIAGNOSIS — B349 Viral infection, unspecified: Secondary | ICD-10-CM | POA: Insufficient documentation

## 2015-01-10 DIAGNOSIS — Z8619 Personal history of other infectious and parasitic diseases: Secondary | ICD-10-CM | POA: Insufficient documentation

## 2015-01-10 DIAGNOSIS — R05 Cough: Secondary | ICD-10-CM | POA: Diagnosis present

## 2015-01-10 DIAGNOSIS — J040 Acute laryngitis: Secondary | ICD-10-CM | POA: Insufficient documentation

## 2015-01-10 DIAGNOSIS — Z880 Allergy status to penicillin: Secondary | ICD-10-CM | POA: Diagnosis not present

## 2015-01-10 MED ORDER — DEXAMETHASONE SODIUM PHOSPHATE 4 MG/ML IJ SOLN
12.0000 mg | Freq: Once | INTRAMUSCULAR | Status: AC
  Administered 2015-01-10: 12 mg via INTRAMUSCULAR
  Filled 2015-01-10: qty 3

## 2015-01-10 NOTE — ED Notes (Signed)
Pt states that she got the flu shot for the first time last Monday and on Thursday started feeling very bad with generalized aches, cough, fever, and congestion.  Also c/o nausea.

## 2015-01-10 NOTE — ED Provider Notes (Signed)
CSN: [id number]     Arrival date & time 01/10/15  1033 History  By signing my name below, I, Evelene Croon, attest that this documentation has been prepared under the direction and in the presence of Virgel Manifold, MD . Electronically Signed: Evelene Croon, Scribe. 01/10/2015. 11:56 AM.  Chief Complaint  Patient presents with  . Generalized Body Aches    The history is provided by the patient. No language interpreter was used.    HPI Comments:  Theresa Farrell is a 39 y.o. female who presents to the Emergency Department complaining of generalized body aches for ~ 6 days. Pt notes she received flu shot~ 10 days ago. Pt reports associated HA, productive cough with green sputum, chills, sore throat and hoarse voice. She has taken  theraflu and nyquil without relief. She denies ear pain and fever.No alleviating factors noted.   Past Medical History  Diagnosis Date  . Herpes     seropositive   Past Surgical History  Procedure Laterality Date  . Wisdom tooth extraction    . Tubal ligation N/A 03/25/2013    Procedure: POST PARTUM TUBAL LIGATION;  Surgeon: Woodroe Mode, MD;  Location: [facility];  Service: Gynecology;  Laterality: N/A;   Family History  Problem Relation Age of Onset  . Cancer Mother 19    breast  . Hypertension Mother   . Diabetes Mother    Social History  Substance Use Topics  . Smoking status: Never Smoker   . Smokeless tobacco: Never Used  . Alcohol Use: No   OB History    Gravida Para Term Preterm AB TAB SAB Ectopic Multiple Living   3 3 3  0      3     Review of Systems  Constitutional: Positive for chills. Negative for fever.  HENT: Positive for sore throat and voice change. Negative for ear pain.   Respiratory: Positive for cough.   Musculoskeletal: Positive for myalgias (generalized).  Neurological: Positive for headaches.  All other systems reviewed and are negative.   Allergies  Amoxicillin and Penicillins  Home Medications   Prior to  Admission medications   Medication Sig Start Date End Date Taking? Authorizing Provider  HYDROcodone-acetaminophen (NORCO/VICODIN) 5-325 MG tablet Take 1 tablet by mouth every 6 (six) hours as needed for moderate pain.   Yes Historical Provider, MD  ibuprofen (ADVIL,MOTRIN) 600 MG tablet Take 1 tablet (600 mg total) by mouth every 6 (six) hours. 03/26/13  Yes Myrtis Ser, CNM  Phenyleph-Doxylamine-DM-APAP (NYQUIL SEVERE COLD/FLU PO) Take 30 mLs by mouth daily as needed (cold).   Yes Historical Provider, MD  Pseudoephedrine-APAP-DM (DAYTIME COLD/FLU PO) Take 30 mLs by mouth daily as needed (cold).   Yes Historical Provider, MD   BP 114/86 mmHg  Pulse 83  Temp(Src) 98.3 F (36.8 C) (Oral)  Resp 18  Ht 5\' 4"  (1.626 m)  Wt 175 lb (79.379 kg)  BMI 30.02 kg/m2  SpO2 100%  LMP 01/03/2015 Physical Exam  Constitutional: She is oriented to person, place, and time. She appears well-developed and well-nourished. No distress.  HENT:  Head: Normocephalic and atraumatic.  Pharyngitis without exudate  uvula midline Hoarse   Eyes: Conjunctivae are normal.  Cardiovascular: Normal rate, regular rhythm and normal heart sounds.   Pulmonary/Chest: Effort normal and breath sounds normal. No respiratory distress.  Abdominal: She exhibits no distension.  Neurological: She is alert and oriented to person, place, and time.  Skin: Skin is warm and dry.  Psychiatric: She has a  normal mood and affect.  Nursing note and vitals reviewed.   ED Course  Procedures   DIAGNOSTIC STUDIES:  Oxygen Saturation is 100% on RA, normal by my interpretation.    COORDINATION OF CARE:  11:28 AM Will order steroids and CXR. Discussed treatment plan with pt at bedside and pt agreed to plan.  Labs Review Labs Reviewed - No data to display  Imaging Review No results found. I have personally reviewed and evaluated these images as part of my medical decision-making.   EKG Interpretation None      MDM   Final  diagnoses:  Viral illness  Laryngitis    39 year old female likely viral illness. She is generally well-appearing. No increased work of breathing. Plan symptomatic treatment. Low suspicion for serious bacterial illness.  I personally preformed the services scribed in my presence. The recorded information has been reviewed is accurate. Virgel Manifold, MD.    Virgel Manifold, MD 01/21/15 [phone]

## 2015-01-10 NOTE — Discharge Instructions (Signed)
Laryngitis Laryngitis is inflammation of your vocal cords. This causes hoarseness, coughing, loss of voice, sore throat, or a dry throat. Your vocal cords are two bands of muscles that are found in your throat. When you speak, these cords come together and vibrate. These vibrations come out through your mouth as sound. When your vocal cords are inflamed, your voice sounds different. Laryngitis can be temporary (acute) or long-term (chronic). Most cases of acute laryngitis improve with time. Chronic laryngitis is laryngitis that lasts for more than three weeks. CAUSES Acute laryngitis may be caused by:  A viral infection. Upper Respiratory Infection, Adult Most upper respiratory infections (URIs) are a viral infection of the air passages leading to the lungs. A URI affects the nose, throat, and upper air passages. The most common type of URI is nasopharyngitis and is typically referred to as "the common cold." URIs run their course and usually go away on their own. Most of the time, a URI does not require medical attention, but sometimes a bacterial infection in the upper airways can follow a viral infection. This is called a secondary infection. Sinus and middle ear infections are common types of secondary upper respiratory infections. Bacterial pneumonia can also complicate a URI. A URI can worsen asthma and chronic obstructive pulmonary disease (COPD). Sometimes, these complications can require emergency medical care and may be life threatening.  CAUSES Almost all URIs are caused by viruses. A virus is a type of germ and can spread from one person to another.  RISKS FACTORS You may be at risk for a URI if:  You smoke.  You have chronic heart or lung disease. You have a weakened defense (immune) system.  You are very young or very old.  You have nasal allergies or asthma. You work in crowded or poorly ventilated areas. You work in health care facilities or schools. SIGNS AND SYMPTOMS    Symptoms typically develop 2-3 days after you come in contact with a cold virus. Most viral URIs last 7-10 days. However, viral URIs from the influenza virus (flu virus) can last 14-18 days and are typically more severe. Symptoms may include:  Runny or stuffy (congested) nose.  Sneezing.  Cough.  Sore throat.  Headache.  Fatigue.  Fever.  Loss of appetite.  Pain in your forehead, behind your eyes, and over your cheekbones (sinus pain). Muscle aches.  DIAGNOSIS  Your health care provider may diagnose a URI by: Physical exam. Tests to check that your symptoms are not due to another condition such as: Strep throat. Sinusitis. Pneumonia. Asthma. TREATMENT  A URI goes away on its own with time. It cannot be cured with medicines, but medicines may be prescribed or recommended to relieve symptoms. Medicines may help: Reduce your fever. Reduce your cough. Relieve nasal congestion. HOME CARE INSTRUCTIONS  Take medicines only as directed by your health care provider.  Gargle warm saltwater or take cough drops to comfort your throat as directed by your health care provider. Use a warm mist humidifier or inhale steam from a shower to increase air moisture. This may make it easier to breathe. Drink enough fluid to keep your urine clear or pale yellow.  Eat soups and other clear broths and maintain good nutrition.  Rest as needed.  Return to work when your temperature has returned to normal or as your health care provider advises. You may need to stay home longer to avoid infecting others. You can also use a face mask and careful hand washing to  prevent spread of the virus. Increase the usage of your inhaler if you have asthma.  Do not use any tobacco products, including cigarettes, chewing tobacco, or electronic cigarettes. If you need help quitting, ask your health care provider. PREVENTION  The best way to protect yourself from getting a cold is to practice good hygiene.   Avoid oral or hand contact with people with cold symptoms.  Wash your hands often if contact occurs.  There is no clear evidence that vitamin C, vitamin E, echinacea, or exercise reduces the chance of developing a cold. However, it is always recommended to get plenty of rest, exercise, and practice good nutrition.  SEEK MEDICAL CARE IF:  You are getting worse rather than better.  Your symptoms are not controlled by medicine.  You have chills. You have worsening shortness of breath. You have brown or red mucus. You have yellow or brown nasal discharge. You have pain in your face, especially when you bend forward. You have a fever. You have swollen neck glands. You have pain while swallowing. You have white areas in the back of your throat. SEEK IMMEDIATE MEDICAL CARE IF:  You have severe or persistent: Headache. Ear pain. Sinus pain. Chest pain. You have chronic lung disease and any of the following: Wheezing. Prolonged cough. Coughing up blood. A change in your usual mucus. You have a stiff neck. You have changes in your: Vision. Hearing. Thinking. Mood. MAKE SURE YOU:  Understand these instructions. Will watch your condition. Will get help right away if you are not doing well or get worse.   This information is not intended to replace advice given to you by your health care provider. Make sure you discuss any questions you have with your health care provider.   Document Released: 08/27/2000 Document Revised: 07/18/2014 Document Reviewed: 06/08/2013 Elsevier Interactive Patient Education 2016 [place] of talking, yelling, or singing. This is also called vocal strain.  Bacterial infections. Chronic laryngitis may be caused by:  Vocal strain.  Injury to your vocal cords.  Acid reflux (gastroesophageal reflux disease or GERD).  Allergies.  Sinus infection.  Smoking.  Alcohol abuse.  Breathing in chemicals or dust.  Growths on the vocal  cords. RISK FACTORS Risk factors for laryngitis include:  Smoking.  Alcohol abuse.  Having allergies. SIGNS AND SYMPTOMS Symptoms of laryngitis may include:  Low, hoarse voice.  Loss of voice.  Dry cough.  Sore throat.  Stuffy nose. DIAGNOSIS Laryngitis may be diagnosed by:  Physical exam.  Throat culture.  Blood test.  Laryngoscopy. This procedure allows your health care provider to look at your vocal cords with a mirror or viewing tube. TREATMENT Treatment for laryngitis depends on what is causing it. Usually, treatment involves resting your voice and using medicines to soothe your throat. However, if your laryngitis is caused by a bacterial infection, you may need to take antibiotic medicine. If your laryngitis is caused by a growth, you may need to have a procedure to remove it. HOME CARE INSTRUCTIONS  Drink enough fluid to keep your urine clear or pale yellow.  Breathe in moist air. Use a humidifier if you live in a dry climate.  Take medicines only as directed by your health care provider.  If you were prescribed an antibiotic medicine, finish it all even if you start to feel better.  Do not smoke cigarettes or electronic cigarettes. If you need help quitting, ask your health care provider.  Talk as little as possible. Also  avoid whispering, which can cause vocal strain.  Write instead of talking. Do this until your voice is back to normal. SEEK MEDICAL CARE IF:  You have a fever.  You have increasing pain.  You have difficulty swallowing. SEEK IMMEDIATE MEDICAL CARE IF:  You cough up blood.  You have trouble breathing.   This information is not intended to replace advice given to you by your health care provider. Make sure you discuss any questions you have with your health care provider.   Document Released: 03/03/2005 Document Revised: 03/24/2014 Document Reviewed: 08/16/2013 Elsevier Interactive Patient Education [employer].

## 2015-01-10 NOTE — ED Notes (Signed)
MD at bedside. 

## 2016-12-17 ENCOUNTER — Emergency Department (HOSPITAL_COMMUNITY)
Admission: EM | Admit: 2016-12-17 | Discharge: 2016-12-18 | Disposition: A | Discharge status: Home / Self Care | Payer: BC Managed Care – PPO | Attending: Emergency Medicine | Admitting: Emergency Medicine

## 2016-12-17 ENCOUNTER — Emergency Department (HOSPITAL_COMMUNITY): Payer: BC Managed Care – PPO

## 2016-12-17 ENCOUNTER — Encounter (HOSPITAL_COMMUNITY): Payer: Self-pay

## 2016-12-17 DIAGNOSIS — Z79899 Other long term (current) drug therapy: Secondary | ICD-10-CM | POA: Insufficient documentation

## 2016-12-17 DIAGNOSIS — Z880 Allergy status to penicillin: Secondary | ICD-10-CM | POA: Diagnosis not present

## 2016-12-17 DIAGNOSIS — Z7982 Long term (current) use of aspirin: Secondary | ICD-10-CM | POA: Diagnosis not present

## 2016-12-17 DIAGNOSIS — R0789 Other chest pain: Secondary | ICD-10-CM | POA: Diagnosis not present

## 2016-12-17 DIAGNOSIS — R0602 Shortness of breath: Secondary | ICD-10-CM | POA: Insufficient documentation

## 2016-12-17 LAB — BASIC METABOLIC PANEL
Anion gap: 8 (ref 5–15)
BUN: 9 mg/dL (ref 6–20)
CO2: 22 mmol/L (ref 22–32)
Calcium: 8.7 mg/dL — ABNORMAL LOW (ref 8.9–10.3)
Chloride: 104 mmol/L (ref 101–111)
Creatinine, Ser: 0.76 mg/dL (ref 0.44–1.00)
GFR calc Af Amer: >60 mL/min (ref >60)
GFR calc non Af Amer: >60 mL/min (ref >60)
Glucose, Bld: 108 mg/dL — ABNORMAL HIGH (ref 65–99)
Potassium: 3.6 mmol/L (ref 3.5–5.1)
Sodium: 134 mmol/L — ABNORMAL LOW (ref 135–145)

## 2016-12-17 LAB — CBC WITH DIFFERENTIAL/PLATELET
Basophils Absolute: 0 10*3/uL (ref 0.0–0.1)
Basophils Relative: 0 %
Eosinophils Absolute: 0.1 10*3/uL (ref 0.0–0.7)
Eosinophils Relative: 1 %
HCT: 35.6 % — ABNORMAL LOW (ref 36.0–46.0)
Hemoglobin: 11.7 g/dL — ABNORMAL LOW (ref 12.0–15.0)
Lymphocytes Relative: 26 %
Lymphs Abs: 2 10*3/uL (ref 0.7–4.0)
MCH: 27.8 pg (ref 26.0–34.0)
MCHC: 32.9 g/dL (ref 30.0–36.0)
MCV: 84.6 fL (ref 78.0–100.0)
Monocytes Absolute: 0.5 10*3/uL (ref 0.1–1.0)
Monocytes Relative: 6 %
Neutro Abs: 5.4 10*3/uL (ref 1.7–7.7)
Neutrophils Relative %: 67 %
Platelets: 376 10*3/uL (ref 150–400)
RBC: 4.21 MIL/uL (ref 3.87–5.11)
RDW: 13.4 % (ref 11.5–15.5)
WBC: 8 10*3/uL (ref 4.0–10.5)

## 2016-12-17 LAB — TROPONIN I: Troponin I: <0.03 ng/mL (ref <0.03)

## 2016-12-17 LAB — D-DIMER, QUANTITATIVE: D-Dimer, Quant: 0.27 ug/mL-FEU (ref 0.00–0.50)

## 2016-12-17 NOTE — ED Provider Notes (Signed)
[facility] DEPT Provider Note   CSN: [id number] Arrival date & time: 12/17/16  1911     History   Chief Complaint Chief Complaint  Patient presents with  . Chest Pain    HPI Theresa Farrell is a 41 y.o. female.  Patient is a 41 year old female with past medical history of fibroids. She presents today for evaluation of chest pain. She reports beginning an exercise routine for approximately one week ago. She reports running several miles, then the next day her chest discomfort began. She describes this as sharp in nature and located behind her left breast. It is worse when she changes position, moves, takes a deep breath. This evening she kind of flight of stairs and became short of breath. She presents this evening to have this checked out. She denies any leg swelling or calf pain. She denies any fevers, chills, or productive cough.  She has no cardiac risk factors.   The history is provided by the patient.  Chest Pain   This is a new problem. Episode onset: Several days ago. The problem occurs constantly. The problem has not changed since onset.The pain is associated with movement, breathing and raising an arm. The pain is moderate. The quality of the pain is described as sharp. The pain does not radiate. Associated symptoms include shortness of breath. Pertinent negatives include no diaphoresis, no fever, no orthopnea, no palpitations, no sputum production and no syncope.    Past Medical History:  Diagnosis Date  . Herpes    seropositive    Patient Active Problem List   Diagnosis Date Noted  . Nerve pain 05/02/2013  . Encounter for female sterilization procedure 03/25/2013  . 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 Fibroid 09/14/2012    Past Surgical History:  Procedure Laterality Date  . TUBAL LIGATION N/A 03/25/2013   Procedure: POST PARTUM TUBAL LIGATION;  Surgeon: Woodroe Mode, MD;  Location: [facility];  Service: Gynecology;  Laterality: N/A;  . WISDOM TOOTH EXTRACTION       OB History    Gravida Para Term Preterm AB Living   3 3 3  0   3   SAB TAB Ectopic Multiple Live Births           3       Home Medications    Prior to Admission medications   Medication Sig Start Date End Date Taking? Authorizing Provider  Aspirin-Caffeine (BAYER BACK & BODY) 500-32.5 MG TABS Take 1 tablet by mouth once as needed (for pain).   Yes [provider]  Multiple Vitamins-Minerals (MEGA MULTIVITAMIN FOR WOMEN) TABS Take 1 tablet by mouth daily.   Yes [provider]    Family History Family History  Problem Relation Age of Onset  . Cancer Mother 36       breast  . Hypertension Mother   . Diabetes Mother     Social History Social History  Substance Use Topics  . Smoking status: Never Smoker  . Smokeless tobacco: Never Used  . Alcohol use Yes     Comment: occasionally     Allergies   Amoxicillin and Penicillins   Review of Systems Review of Systems  Constitutional: Negative for diaphoresis and fever.  Respiratory: Positive for shortness of breath. Negative for sputum production.   Cardiovascular: Positive for chest pain. Negative for palpitations, orthopnea and syncope.  All other systems reviewed and are negative.    Physical Exam Updated Vital Signs BP 110/67 (BP Location: Right Arm)   Pulse 81  Temp 99.2 F (37.3 C) (Oral)   Resp 14   Ht 5\' 4"  (1.626 m)   Wt 81.6 kg (180 lb)   LMP 11/17/2016 (Approximate)   SpO2 100%   BMI 30.90 kg/m   Physical Exam  Constitutional: She is oriented to person, place, and time. She appears well-developed and well-nourished. No distress.  HENT:  Head: Normocephalic and atraumatic.  Neck: Normal range of motion. Neck supple.  Cardiovascular: Normal rate and regular rhythm.  Exam reveals no gallop and no friction rub.   No murmur heard. Pulmonary/Chest: Effort normal and breath sounds normal. No respiratory distress. She has no wheezes. She has no rales. She exhibits no tenderness.    Abdominal: Soft. Bowel sounds are normal. She exhibits no distension. There is no tenderness.  Musculoskeletal: Normal range of motion. She exhibits no edema.  There is no calf tenderness or swelling. Homans sign is absent bilaterally.  Neurological: She is alert and oriented to person, place, and time.  Skin: Skin is warm and dry. She is not diaphoretic.  Nursing note and vitals reviewed.    ED Treatments / Results  Labs (all labs ordered are listed, but only abnormal results are displayed) Labs Reviewed  CBC WITH DIFFERENTIAL/PLATELET - Abnormal; Notable for the following:       Result Value   Hemoglobin 11.7 (*)    HCT 35.6 (*)    All other components within normal limits  BASIC METABOLIC PANEL - Abnormal; Notable for the following:    Sodium 134 (*)    Glucose, Bld 108 (*)    Calcium 8.7 (*)    All other components within normal limits  TROPONIN I  D-DIMER, QUANTITATIVE (NOT AT [facility])    EKG  EKG Interpretation  Date/Time:  Wednesday December 17 2016 19:54:12 EDT Ventricular Rate:  95 PR Interval:  144 QRS Duration: 70 QT Interval:  340 QTC Calculation: 427 R Axis:   42 Text Interpretation:  Normal sinus rhythm Normal ECG Confirmed by Veryl Speak [phone]) on 12/17/2016 11:24:18 PM       Radiology Dg Chest 2 View  Result Date: 12/17/2016 CLINICAL DATA:  Chest pain. EXAM: CHEST  2 VIEW COMPARISON:  Chest x-ray dated January 10, 2015. FINDINGS: The heart size and mediastinal contours are within normal limits. Both lungs are clear. The visualized skeletal structures are unremarkable. IMPRESSION: No active cardiopulmonary disease. Electronically Signed   By: Titus Dubin M.D.   On: 12/17/2016 20:18    Procedures Procedures (including critical care time)  Medications Ordered in ED Medications - No data to display   Initial Impression / Assessment and Plan / ED Course  I have reviewed the triage vital signs and the nursing notes.  Pertinent labs & imaging  results that were available during my care of the patient were reviewed by me and considered in my medical decision making (see chart for details).  Patient presents here with complaints of chest discomfort that is sharp in nature and located behind her left breast. This is been ongoing for several days. It is worse with change in position, and breathing. I highly suspect a musculoskeletal etiology. Her chest x-ray is clear, troponin is negative, EKG is normal, and d-dimer is negative.  Final Clinical Impressions(s) / ED Diagnoses   Final diagnoses:  None    New Prescriptions New Prescriptions   No medications on file     Veryl Speak, MD 12/18/16 0021

## 2016-12-17 NOTE — ED Triage Notes (Signed)
Pt reports she started working out recently and ran 66miles 8 days ago.  Reports the next day she started having chest pain.  Pt says thought it was just soreness but says when she leans over she feels like something is "heavy" on her heart.  Also reports pain in left shoulder, left breast, and left side of neck.  Pt says it is very dull and feels like a cramp.  Also reports SOB without exerting herself.

## 2016-12-18 MED ORDER — NAPROXEN 500 MG PO TABS: 500.0000 mg | ORAL_TABLET | Freq: Two times a day (BID) | ORAL | 0 refills | Status: DC

## 2016-12-18 MED ORDER — TRAMADOL HCL 50 MG PO TABS: 50.0000 mg | ORAL_TABLET | Freq: Four times a day (QID) | ORAL | 0 refills | Status: DC | PRN

## 2016-12-18 NOTE — ED Notes (Signed)
Pt ambulatory to waiting room. Pt verbalized understanding of discharge instructions.   

## 2016-12-18 NOTE — Discharge Instructions (Signed)
Naproxen as prescribed. Tramadol as prescribed as needed for pain not relieved with naproxen.  Follow-up with your primary Dr. if your symptoms are not improving in the next 3-4 days, and return to the ER if symptoms significantly worsen or change in the meantime.

## 2017-04-02 ENCOUNTER — Other Ambulatory Visit: Payer: Self-pay | Admitting: Women's Health

## 2017-04-30 ENCOUNTER — Other Ambulatory Visit (HOSPITAL_COMMUNITY)
Admission: RE | Admit: 2017-04-30 | Discharge: 2017-04-30 | Disposition: A | Discharge status: Home / Self Care | Payer: BC Managed Care – PPO | Source: Ambulatory Visit | Attending: Obstetrics & Gynecology | Admitting: Obstetrics & Gynecology

## 2017-04-30 ENCOUNTER — Ambulatory Visit (INDEPENDENT_AMBULATORY_CARE_PROVIDER_SITE_OTHER): Payer: BC Managed Care – PPO | Admitting: Women's Health

## 2017-04-30 ENCOUNTER — Encounter: Payer: Self-pay | Admitting: Women's Health

## 2017-04-30 VITALS — BP 118/70 | HR 90 | Ht 65.5 in | Wt 184.8 lb

## 2017-04-30 DIAGNOSIS — R87610 Atypical squamous cells of undetermined significance on cytologic smear of cervix (ASC-US): Secondary | ICD-10-CM | POA: Insufficient documentation

## 2017-04-30 DIAGNOSIS — Z01419 Encounter for gynecological examination (general) (routine) without abnormal findings: Secondary | ICD-10-CM

## 2017-04-30 DIAGNOSIS — Z113 Encounter for screening for infections with a predominantly sexual mode of transmission: Secondary | ICD-10-CM | POA: Diagnosis not present

## 2017-04-30 DIAGNOSIS — Z01411 Encounter for gynecological examination (general) (routine) with abnormal findings: Secondary | ICD-10-CM

## 2017-04-30 DIAGNOSIS — R6882 Decreased libido: Secondary | ICD-10-CM | POA: Diagnosis not present

## 2017-04-30 DIAGNOSIS — R070 Pain in throat: Secondary | ICD-10-CM | POA: Diagnosis not present

## 2017-04-30 NOTE — Progress Notes (Signed)
   WELL-WOMAN EXAMINATION Patient name: Theresa Farrell MRN [id number]  Date of birth: 1975-06-28 Chief Complaint:   Gynecologic Exam (itchy throat; drainage; has no sex drive)  History of Present Illness:   Theresa Farrell is a 42 y.o. [id number] African American female being seen today for a routine well-woman exam.  Current complaints: itchy throat x few days and some sinus drainage, no fever/chills or any other sx. Decreased sex drive since birth of her last child 4 yrs ago. Was dx w/ mild depression/PTSD by [facility] doctor, placed on Lexapro, didn't like the way it made her feel, so stopped taking it. Feels she is doing great w/o it, has started getting into crafts, etc. Likes more natural way of doing things.   [facility] [place] [facility] [place], [place]      does desire labs-STD screening only, [facility] does all other [facility] Patient's last menstrual period was 04/14/2017. The current method of family planning is tubal ligation [facility] 2018 [facility]/ [facility]. Results were: normal, wants another pap now Last mammogram: last month [facility]/ [facility]. Results were: normal Last colonoscopy: never. Results were: n/a  Review of Systems:   Pertinent items are noted in HPI Denies any headaches, blurred vision, fatigue, shortness of breath, chest pain, abdominal pain, abnormal vaginal discharge/itching/odor/irritation, problems with periods, bowel movements, urination, or intercourse unless otherwise stated above. Pertinent History Reviewed:  Reviewed past medical,surgical, social and family history.  Reviewed problem list, medications and allergies. Physical Assessment:   Vitals:   04/30/17 1339  BP: 118/70  Pulse: 90  Weight: 184 lb 12.8 oz (83.8 kg)  Height: 5' 5.5" (1.664 m)  Body mass index is 30.28 kg/m.        Physical Examination:   General appearance - well appearing, and in no distress  Mental status - alert, oriented to person, place, and time  Psych:  She has a normal mood and affect  Skin - warm and dry, normal  color, no suspicious lesions noted  Chest - effort normal, all lung fields clear to auscultation bilaterally  Heart - normal rate and regular rhythm  Neck:  midline trachea, no thyromegaly or nodules  Breasts - breasts appear normal, no suspicious masses, no skin or nipple changes or  axillary nodes  Abdomen - soft, nontender, nondistended, no masses or organomegaly  Pelvic - VULVA: normal appearing vulva with no masses, tenderness or lesions  VAGINA: normal appearing vagina with normal color and discharge, no lesions  CERVIX: normal appearing cervix without discharge or lesions, no CMT  Thin prep pap is done w/ HR HPV cotesting  UTERUS: uterus is felt to be normal size, shape, consistency and nontender   ADNEXA: No adnexal masses or tenderness noted.  Extremities:  No swelling or varicosities noted  No results found for this or any previous visit (from the past 24 hour(s)).  Assessment & Plan:  1) Well-Woman Exam  2) STD screen  3) Decreased libido> purposefully increase frequency of sex to see if desire follows, can try essential oils, etc. Briefly discussed Addy, let me know if interested  Labs/procedures today: pap w/ gc/ct, hiv, rpr, hep b  Mammogram 80yr or sooner if problems Colonoscopy @41yo , or sooner if problems  Orders Placed This Encounter  Procedures  . HIV antibody  . RPR  . Hepatitis B surface antigen    Follow-up: Return in about 1 year (around 04/30/2018) for Physical.  [place], WHNP-BC 04/30/2017 2:17 PM

## 2017-04-30 NOTE — Patient Instructions (Addendum)
You have a viral infection that will resolve on its own over time.  Symptoms typically last 3-7 days but can stretch out to 2-3 weeks.  Unfortunately, antibiotics are not helpful for viral infections.  Humidifier and saline nasal spray for nasal congestion  Regular robitussin, cough drops for cough  Warm salt water gargles for sore throat  Mucinex with lots of water to help you cough up the mucous in your chest if needed  Drink plenty of fluids and stay hydrated!  Wash your hands frequently.  Call if you are not improving by 7-10 days.  Constipation  Drink plenty of fluid, preferably water, throughout the day  Eat foods high in fiber such as fruits, vegetables, and grains  Exercise, such as walking, is a good way to keep your bowels regular  Drink warm fluids, especially warm prune juice, or decaf coffee  Eat a 1/2 cup of real oatmeal (not instant), 1/2 cup applesauce, and 1/2-1 cup warm prune juice every day  If needed, you may take Colace (docusate sodium) stool softener once or twice a day to help keep the stool soft. If you are pregnant, wait until you are out of your first trimester (12-14 weeks of pregnancy)  If you still are having problems with constipation, you may take Miralax once daily as needed to help keep your bowels regular.  If you are pregnant, wait until you are out of your first trimester (12-14 weeks of pregnancy)

## 2017-05-01 LAB — HIV ANTIBODY (ROUTINE TESTING W REFLEX): HIV Screen 4th Generation wRfx: NONREACTIVE

## 2017-05-01 LAB — RPR: RPR Ser Ql: NONREACTIVE

## 2017-05-01 LAB — HEPATITIS B SURFACE ANTIGEN: Hepatitis B Surface Ag: NEGATIVE

## 2017-05-05 LAB — CYTOLOGY - PAP
Chlamydia: NEGATIVE
Diagnosis: UNDETERMINED — AB
HPV: NOT DETECTED
Neisseria Gonorrhea: NEGATIVE

## 2017-05-06 ENCOUNTER — Telehealth: Payer: Self-pay | Admitting: Adult Health

## 2017-05-06 ENCOUNTER — Encounter: Payer: Self-pay | Admitting: Adult Health

## 2017-05-06 DIAGNOSIS — R87610 Atypical squamous cells of undetermined significance on cytologic smear of cervix (ASC-US): Secondary | ICD-10-CM

## 2017-05-06 HISTORY — DX: Atypical squamous cells of undetermined significance on cytologic smear of cervix (ASC-US): R87.610

## 2017-05-06 NOTE — Telephone Encounter (Signed)
Pt aware that pap ASCUS but negative HPV and GC/CHL, so lets repeat it in 1 year

## 2017-05-12 ENCOUNTER — Telehealth: Payer: Self-pay | Admitting: *Deleted

## 2017-05-12 MED ORDER — AZITHROMYCIN 250 MG PO TABS: ORAL_TABLET | ORAL | 0 refills | Status: DC

## 2017-05-12 NOTE — Telephone Encounter (Signed)
Will rx Z pack

## 2017-05-12 NOTE — Telephone Encounter (Signed)
Pt states that she saw Theresa Farrell on 2/14 and Theresa Farrell told her that her lymph nodes in her neck were swollen. She has since started feeling sick and tried using otc meds. She now is experiencing a very sore throat, congestion, cough she also has a low grade fever. She doesn't think that she has the flu. Patient states Theresa Farrell told her to call us or come see her if she got worse.  Discussed patient with Anderson Malta she will send in abx for patient. Patient informed to use otc's to help with symptoms as well. Patient agreeable and has no further questions or concerns.

## 2018-02-26 ENCOUNTER — Encounter: Payer: Self-pay | Admitting: Women's Health

## 2019-08-01 ENCOUNTER — Other Ambulatory Visit (HOSPITAL_COMMUNITY): Payer: Self-pay | Admitting: Family

## 2019-08-01 ENCOUNTER — Inpatient Hospital Stay
Admission: RE | Admit: 2019-08-01 | Discharge: 2019-08-01 | Disposition: A | Discharge status: Home / Self Care | Payer: Self-pay | Source: Ambulatory Visit | Attending: Family | Admitting: Family

## 2019-08-01 DIAGNOSIS — N63 Unspecified lump in unspecified breast: Secondary | ICD-10-CM

## 2019-08-09 ENCOUNTER — Other Ambulatory Visit: Payer: Self-pay

## 2019-08-09 ENCOUNTER — Ambulatory Visit (HOSPITAL_COMMUNITY)
Admission: RE | Admit: 2019-08-09 | Discharge: 2019-08-09 | Disposition: A | Discharge status: Home / Self Care | Payer: No Typology Code available for payment source | Source: Ambulatory Visit | Attending: Family | Admitting: Family

## 2019-08-09 ENCOUNTER — Encounter (HOSPITAL_COMMUNITY): Payer: Self-pay

## 2019-08-09 DIAGNOSIS — N63 Unspecified lump in unspecified breast: Secondary | ICD-10-CM

## 2020-06-25 ENCOUNTER — Other Ambulatory Visit: Payer: Self-pay | Admitting: Internal Medicine

## 2020-06-25 ENCOUNTER — Other Ambulatory Visit (HOSPITAL_COMMUNITY): Payer: Self-pay | Admitting: Internal Medicine

## 2020-06-25 DIAGNOSIS — R202 Paresthesia of skin: Secondary | ICD-10-CM

## 2020-06-25 DIAGNOSIS — R42 Dizziness and giddiness: Secondary | ICD-10-CM

## 2020-07-11 ENCOUNTER — Ambulatory Visit (HOSPITAL_COMMUNITY)
Admission: RE | Admit: 2020-07-11 | Discharge: 2020-07-11 | Disposition: A | Discharge status: Home / Self Care | Payer: No Typology Code available for payment source | Source: Ambulatory Visit | Attending: Internal Medicine | Admitting: Internal Medicine

## 2020-07-11 ENCOUNTER — Other Ambulatory Visit: Payer: Self-pay

## 2020-07-11 DIAGNOSIS — R42 Dizziness and giddiness: Secondary | ICD-10-CM | POA: Diagnosis present

## 2020-07-11 DIAGNOSIS — R202 Paresthesia of skin: Secondary | ICD-10-CM | POA: Insufficient documentation

## 2021-02-11 ENCOUNTER — Other Ambulatory Visit (HOSPITAL_COMMUNITY): Payer: Self-pay | Admitting: Family Medicine

## 2021-02-11 DIAGNOSIS — Z1231 Encounter for screening mammogram for malignant neoplasm of breast: Secondary | ICD-10-CM

## 2021-02-20 ENCOUNTER — Other Ambulatory Visit: Payer: Self-pay

## 2021-02-20 ENCOUNTER — Ambulatory Visit (HOSPITAL_COMMUNITY)
Admission: RE | Admit: 2021-02-20 | Discharge: 2021-02-20 | Disposition: A | Discharge status: Home / Self Care | Payer: No Typology Code available for payment source | Source: Ambulatory Visit | Attending: Family Medicine | Admitting: Family Medicine

## 2021-02-20 DIAGNOSIS — Z1231 Encounter for screening mammogram for malignant neoplasm of breast: Secondary | ICD-10-CM | POA: Diagnosis present

## 2021-03-14 ENCOUNTER — Other Ambulatory Visit (HOSPITAL_COMMUNITY)
Admission: RE | Admit: 2021-03-14 | Discharge: 2021-03-14 | Disposition: A | Discharge status: Home / Self Care | Payer: No Typology Code available for payment source | Source: Ambulatory Visit | Attending: Obstetrics & Gynecology | Admitting: Obstetrics & Gynecology

## 2021-03-14 ENCOUNTER — Other Ambulatory Visit: Payer: Self-pay

## 2021-03-14 ENCOUNTER — Encounter: Payer: Self-pay | Admitting: Obstetrics & Gynecology

## 2021-03-14 ENCOUNTER — Ambulatory Visit (INDEPENDENT_AMBULATORY_CARE_PROVIDER_SITE_OTHER): Payer: No Typology Code available for payment source | Admitting: Obstetrics & Gynecology

## 2021-03-14 VITALS — BP 95/56 | HR 63 | Ht 64.0 in | Wt 199.0 lb

## 2021-03-14 DIAGNOSIS — Z01411 Encounter for gynecological examination (general) (routine) with abnormal findings: Secondary | ICD-10-CM | POA: Insufficient documentation

## 2021-03-14 DIAGNOSIS — Z803 Family history of malignant neoplasm of breast: Secondary | ICD-10-CM | POA: Diagnosis not present

## 2021-03-14 DIAGNOSIS — F520 Hypoactive sexual desire disorder: Secondary | ICD-10-CM | POA: Diagnosis not present

## 2021-03-14 MED ORDER — ADDYI 100 MG PO TABS: 1.0000 | ORAL_TABLET | Freq: Every evening | ORAL | 4 refills | Status: AC

## 2021-03-14 NOTE — Progress Notes (Signed)
WELL-WOMAN EXAMINATION Patient name: Theresa Farrell MRN [id number]  Date of birth: 1976-03-08 Chief Complaint:   New Patient (Initial Visit)  History of Present Illness:   Theresa Farrell is a 45 y.o. G50P3003 female being seen today for a routine well-woman exam.   Today she notes: -Decreased libido: This has been an ongoing issue.  Initially thought it was due to her medication, but discontinued cymbalta- it's been a year and still no improvement.  Reports that she has a great marriage, finds husband attractive, but just never in the mood.  She has been working on improving PTSD without meds by using alternative therapy treatment and while this has helped, she has not seen a change in her sex drive.  Notes occasional discomfort, but that is not the main issue.  Menses regular each month- denies intermenstrual or HMB.  Denies dysmenorrhea.  Patient's last menstrual period was 02/08/2021 (approximate).  The current method of family planning is tubal ligation.    Last pap 2019.  Last mammogram: 02/2021. Last colonoscopy: n/a  Depression screen [facility] 2/9 03/14/2021 04/30/2017  Decreased Interest 1 0  Down, Depressed, Hopeless 0 0  PHQ - 2 Score 1 0  Altered sleeping 0 -  Tired, decreased energy 2 -  Change in appetite 1 -  Feeling bad or failure about yourself  0 -  Trouble concentrating 0 -  Moving slowly or fidgety/restless 1 -  Suicidal thoughts 0 -  PHQ-9 Score 5 -    Review of Systems:   Pertinent items are noted in HPI Denies any headaches, blurred vision, fatigue, shortness of breath, chest pain, abdominal pain, bowel movements, urination unless otherwise stated above.  Pertinent History Reviewed:  Reviewed past medical,surgical, social and family history.  Reviewed problem list, medications and allergies.  Physical Assessment:   Vitals:   03/14/21 0944  BP: (!) 95/56  Pulse: 63  Weight: 199 lb (90.3 kg)  Height: 5\' 4"  (1.626 m)  Body mass index is 34.16  kg/m.        Physical Examination:   General appearance - well appearing, and in no distress  Mental status - alert, oriented to person, place, and time  Psych:  She has a normal mood and affect  Skin - warm and dry, normal color, no suspicious lesions noted  Chest - effort normal, all lung fields clear to auscultation bilaterally  Heart - normal rate and regular rhythm  Neck:  midline trachea, no thyromegaly or nodules  Breasts - breasts appear normal, no suspicious masses, no skin or nipple changes or  axillary nodes  Abdomen - soft, nontender, nondistended, no masses or organomegaly  Pelvic - VULVA: normal appearing vulva with no masses, tenderness or lesions  VAGINA: normal appearing vagina with normal color and discharge, no lesions  CERVIX: normal appearing cervix without discharge or lesions, no CMT  Thin prep pap is done with HR HPV cotesting  UTERUS: uterus is felt to be normal size, shape, consistency and nontender   ADNEXA: No adnexal masses or tenderness noted.  Extremities:  No swelling or varicosities noted  Chaperone: Network engineer & Plan:  1) Well-Woman Exam -pap collected, reviewed screening guidelines -mammogram up to date  2) Hypoactive sexual desire disorder -discussed Addyi- pt does not consume EtOH -Rx sent in, discussed concern for cost -if pt starts medication, would advise 3-20mos medication follow up -also discussed herbal supplements  3) Family h/o breast cancer Discussed hereditary cancer- due to  maternal breast Ca, no other relatives.   -Declined [employer] testing  Meds:  Meds ordered this encounter  Medications   Flibanserin (ADDYI) 100 MG TABS    Sig: Take 1 tablet by mouth at bedtime.    Dispense:  90 tablet    Refill:  4    Follow-up: Return in about 1 year (around 03/14/2022) for Annual.   Janyth Pupa, DO Attending [place], [place] for [facility], [place]

## 2021-03-21 ENCOUNTER — Ambulatory Visit (INDEPENDENT_AMBULATORY_CARE_PROVIDER_SITE_OTHER): Payer: 59

## 2021-03-21 ENCOUNTER — Ambulatory Visit
Admission: EM | Admit: 2021-03-21 | Discharge: 2021-03-21 | Disposition: A | Discharge status: Home / Self Care | Payer: No Typology Code available for payment source | Attending: Family Medicine | Admitting: Family Medicine

## 2021-03-21 ENCOUNTER — Other Ambulatory Visit: Payer: Self-pay

## 2021-03-21 ENCOUNTER — Encounter: Payer: Self-pay | Admitting: Emergency Medicine

## 2021-03-21 DIAGNOSIS — M5431 Sciatica, right side: Secondary | ICD-10-CM | POA: Diagnosis not present

## 2021-03-21 DIAGNOSIS — M545 Low back pain, unspecified: Secondary | ICD-10-CM

## 2021-03-21 LAB — CYTOLOGY - PAP
Comment: NEGATIVE
Diagnosis: NEGATIVE
Diagnosis: REACTIVE
High risk HPV: NEGATIVE

## 2021-03-21 MED ORDER — PREDNISONE 10 MG PO TABS: ORAL_TABLET | ORAL | 0 refills | Status: DC

## 2021-03-21 MED ORDER — CYCLOBENZAPRINE HCL 10 MG PO TABS: 10.0000 mg | ORAL_TABLET | Freq: Three times a day (TID) | ORAL | 0 refills | Status: DC | PRN

## 2021-03-21 NOTE — ED Triage Notes (Signed)
Pt presents with right side lower back pain that radiates down her right leg x 3 weeks. She was seen by a chiropractor today for adjustment but did not help.

## 2021-03-22 ENCOUNTER — Other Ambulatory Visit: Payer: Self-pay | Admitting: Obstetrics & Gynecology

## 2021-03-22 DIAGNOSIS — B9689 Other specified bacterial agents as the cause of diseases classified elsewhere: Secondary | ICD-10-CM

## 2021-03-22 MED ORDER — METRONIDAZOLE 500 MG PO TABS: 500.0000 mg | ORAL_TABLET | Freq: Two times a day (BID) | ORAL | 0 refills | Status: AC

## 2021-03-25 NOTE — ED Provider Notes (Signed)
[facility]    CSN: [id number] Arrival date & time: 03/21/21  1734      History   Chief Complaint Chief Complaint  Patient presents with   Back Pain    HPI Theresa Farrell is a 46 y.o. female.   Patient presenting today with about 3 weeks of right low back pain radiating down the posterior right leg now with significant numbness and tingling of the right leg.  She denies known injury to the area, swelling, discoloration, bowel or bladder incontinence, fever, chills, saddle paresthesias.  She has been trying over-the-counter pain relievers, stretches with minimal relief so saw chiropractor today and states the adjustment did not give her any relief either.  She states she has suffered with chronic low back pain after being in the [employer] but has never had any diagnosed orthopedic injuries to the area.  X-rays were done at the chiropractor today but she is requesting a repeat scan here today.   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 Authorizing Provider  cyclobenzaprine (FLEXERIL) 10 MG tablet Take 1 tablet (10 mg total) by mouth 3 (three) times daily as needed for muscle spasms. Do not drink alcohol or drive while taking these medications.  May cause drowsiness. 03/21/21  Yes Volney American, PA-C  predniSONE (DELTASONE) 10 MG tablet Take 6 tabs day one, 5 tabs day two, 4 tabs day three, etc 03/21/21  Yes Volney American, PA-C  aspirin 500 MG tablet Take 500 mg by mouth as needed for pain.    [provider]  Cholecalciferol (VITAMIN D PO) Take by mouth daily.    [provider]  Flibanserin (ADDYI) 100 MG TABS Take 1 tablet by mouth at bedtime. 03/14/21 06/12/21  Janyth Pupa, DO  folic acid (FOLVITE) 1 MG tablet Take 2 tablets by mouth daily. 07/15/19   [provider]  metroNIDAZOLE (FLAGYL) 500 MG tablet Take 1 tablet (500 mg total) by mouth 2 (two) times daily for 7 days. 03/22/21 03/29/21  Janyth Pupa, DO  Multiple Vitamins-Minerals (MEGA MULTIVITAMIN FOR WOMEN) TABS Take 1 tablet by mouth daily.  [provider]    Family History Family History  Problem Relation Age of Onset   Cancer Mother 25       breast   Hypertension Mother    Diabetes Mother    Breast cancer Mother    Stroke Paternal Grandmother    Stroke Maternal Grandmother    Asthma Son     Social History Social History   Tobacco Use   Smoking status: Never   Smokeless tobacco: Never  Vaping Use   Vaping Use: Never used  Substance Use Topics   Alcohol use: Yes    Comment: occasionally   Drug use: No     Allergies   Amoxicillin and Penicillins   Review of Systems Review of Systems Per HPI  Physical Exam Triage Vital Signs ED Triage Vitals  Enc Vitals Group     BP 03/21/21 1839 121/75     Pulse Rate 03/21/21 1839 71     Resp 03/21/21 1839 18     Temp  03/21/21 1839 98.2 F (36.8 C)     Temp Source 03/21/21 1839 Oral     SpO2 03/21/21 1839 98 %     Weight --      Height --      Head Circumference --      Peak Flow --      Pain Score 03/21/21 1840 8     Pain Loc --      Pain Edu? --      Excl. in [place]? --    No data found.  Updated Vital Signs BP 121/75 (BP Location: Right Arm)    Pulse 71    Temp 98.2 F (36.8 C) (Oral)    Resp 18    LMP 03/12/2021    SpO2 98%   Visual Acuity Right Eye Distance:   Left Eye Distance:   Bilateral Distance:    Right Eye Near:   Left Eye Near:    Bilateral Near:     Physical Exam Vitals and nursing note reviewed.  Constitutional:      Appearance: Normal appearance. She is not ill-appearing.  HENT:     Head: Atraumatic.  Eyes:     Extraocular Movements: Extraocular movements intact.     Conjunctiva/sclera: Conjunctivae normal.  Cardiovascular:     Rate and Rhythm: Normal rate and regular rhythm.     Heart sounds: Normal heart sounds.  Pulmonary:     Effort: Pulmonary effort is normal.     Breath sounds: Normal breath sounds.  Musculoskeletal:        General: Tenderness present. No swelling or deformity.     Cervical back: Normal range of motion and neck supple.     Comments: Antalgic gait.  Negative straight leg raise bilaterally.  Tender to palpation right posterior buttock extending down right leg posterior and laterally. No midline spinal tenderness to palpation diffusely.  Skin:    General: Skin is warm and dry.  Neurological:     Mental Status: She is alert and oriented to person, place, and time.     Comments: Right lower extremity neurovascularly intact  Psychiatric:        Mood and Affect: Mood normal.        Thought Content: Thought content normal.        Judgment: Judgment normal.     UC Treatments / Results  Labs (all labs ordered are listed, but only abnormal results are displayed) Labs Reviewed - No data to display  EKG  Radiology No results  found.  Procedures Procedures (including critical care time)  Medications Ordered in UC Medications - No data to display  Initial Impression / Assessment and Plan / UC Course  I have reviewed the triage vital signs and the nursing notes.  Pertinent labs & imaging results that were available during my care of the patient were reviewed by me and considered in my medical decision making (see chart for details).     X-ray of the lumbar spine negative for acute bony abnormality.  We will treat for sciatica with prednisone, Flexeril, stretches, rest.  Discussed follow-up with PCP for recheck, referral to neurosurgery if symptoms worsen or do not improve.  No red flag findings today.  Final Clinical Impressions(s) / UC Diagnoses   Final diagnoses:  Right sided sciatica   Discharge Instructions   None    ED Prescriptions     Medication Sig Dispense Auth. Provider   predniSONE (DELTASONE) 10 MG tablet Take 6 tabs day one, 5 tabs day two, 4 tabs day three, etc 21 tablet Volney American, PA-C   cyclobenzaprine (FLEXERIL) 10 MG tablet Take 1 tablet (10 mg total) by mouth 3 (three) times daily as needed for muscle spasms. Do not drink alcohol or drive while taking these medications.  May cause drowsiness. 15 tablet Volney American, [place]      PDMP not reviewed this encounter.   Volney American, [place] 03/25/21 1949

## 2022-05-22 ENCOUNTER — Ambulatory Visit: Payer: No Typology Code available for payment source

## 2022-05-22 ENCOUNTER — Ambulatory Visit
Admission: EM | Admit: 2022-05-22 | Discharge: 2022-05-22 | Disposition: A | Discharge status: Home / Self Care | Payer: No Typology Code available for payment source

## 2022-05-22 DIAGNOSIS — J309 Allergic rhinitis, unspecified: Secondary | ICD-10-CM | POA: Diagnosis not present

## 2022-05-22 DIAGNOSIS — R0602 Shortness of breath: Secondary | ICD-10-CM

## 2022-05-22 DIAGNOSIS — R059 Cough, unspecified: Secondary | ICD-10-CM

## 2022-05-22 DIAGNOSIS — R062 Wheezing: Secondary | ICD-10-CM

## 2022-05-22 DIAGNOSIS — J209 Acute bronchitis, unspecified: Secondary | ICD-10-CM | POA: Diagnosis not present

## 2022-05-22 MED ORDER — ALBUTEROL SULFATE HFA 108 (90 BASE) MCG/ACT IN AERS: 2.0000 | INHALATION_SPRAY | Freq: Four times a day (QID) | RESPIRATORY_TRACT | 0 refills | Status: DC | PRN

## 2022-05-22 MED ORDER — PREDNISONE 20 MG PO TABS: 40.0000 mg | ORAL_TABLET | Freq: Every day | ORAL | 0 refills | Status: AC

## 2022-05-22 MED ORDER — FLUTICASONE PROPIONATE 50 MCG/ACT NA SUSP: 2.0000 | Freq: Every day | NASAL | 0 refills | Status: DC

## 2022-05-22 MED ORDER — PROMETHAZINE-DM 6.25-15 MG/5ML PO SYRP: 5.0000 mL | ORAL_SOLUTION | Freq: Four times a day (QID) | ORAL | 0 refills | Status: DC | PRN

## 2022-05-22 NOTE — Discharge Instructions (Addendum)
The chest x-ray was negative for pneumonia. Take medication as prescribed.  Increase fluids and allow for plenty of rest. Recommend considering taking an antihistamine daily such as Zyrtec, Claritin, or Allegra. Normal saline nasal spray to help with nasal congestion and runny nose. Recommend use of a humidifier in your bedroom at nighttime during sleep and sleeping elevated on pillows while cough symptoms persist. Warm salt water gargles 3-4 times daily to help with throat pain or discomfort. Please be advised that your cough may continue to persist for the next few weeks.  If you are feeling well, with no fever, chills, shortness of breath, difficulty breathing, but continues to have a nagging cough, recommend over-the-counter throat lozenges or cough drops along with increasing your fluid intake. If you develop a cough with worsening shortness of breath, difficulty breathing, with new symptoms of fever, or other concerns, please follow-up in this clinic or with your primary care physician for further evaluation. Follow-up as needed.

## 2022-05-22 NOTE — ED Triage Notes (Signed)
Pt reports cough, sneezing, shortness of breath and congestion x 5 days. Mucinex and Benadryl gives some relief.   Pt had 2 negative COVID test.

## 2022-05-22 NOTE — ED Provider Notes (Signed)
[facility]    CSN: [id number] Arrival date & time: 05/22/22  1039      History   Chief Complaint Chief Complaint  Patient presents with   Cough    HPI Theresa Farrell is a 47 y.o. female.   The history is provided by the patient.   Patient presents for complaints of sneezing, nasal congestion, runny nose, cough, and shortness of breath have been present for the past 5 days.  Patient also endorses wheezing.  Patient informs when her symptoms initially started, she became hoarse.  She states since that time, her voice has returned to baseline.  She denies fever, chills,, sore throat, pain, abdominal pain, nausea, vomiting, or diarrhea.  Reports she has been taking Mucinex and Benadryl for her symptoms.  She reports she took 2 home COVID test, both were negative.  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  Authorizing Provider  albuterol (VENTOLIN HFA) 108 (90 Base) MCG/ACT inhaler Inhale 2 puffs into the lungs every 6 (six) hours as needed for wheezing or shortness of breath. 05/22/22  Yes Love Chowning-Warren, Alda Lea, NP  dextromethorphan-guaiFENesin (MUCINEX DM) 30-600 MG 12hr tablet Take 1 tablet by mouth 2 (two) times daily.   Yes [provider]  diphenhydrAMINE-PSE-APAP (BENADRYL COLD PO) Take by mouth.   Yes [provider]  fluticasone (FLONASE) 50 MCG/ACT nasal spray Place 2 sprays into both nostrils daily. 05/22/22  Yes Isabel Ardila-Warren, Alda Lea, NP  predniSONE (DELTASONE) 20 MG tablet Take 2 tablets (40 mg total) by mouth daily with breakfast for 5 days. 05/22/22 05/27/22 Yes Jacarri Gesner-Warren, Alda Lea, NP  promethazine-dextromethorphan (PROMETHAZINE-DM) 6.25-15 MG/5ML syrup Take 5 mLs by mouth 4 (four) times daily as needed for cough. 05/22/22  Yes Milfred Krammes-Warren, Alda Lea, NP  aspirin 500 MG tablet Take 500 mg by mouth as needed for pain.    [provider]  Cholecalciferol (VITAMIN D PO) Take by mouth daily.    [provider]  cyclobenzaprine (FLEXERIL) 10 MG tablet Take 1 tablet (10 mg total) by mouth 3 (three) times daily as needed for muscle spasms. Do not drink alcohol or drive  while taking these medications.  May cause drowsiness. 03/21/21   Volney American, PA-C  folic acid (FOLVITE) 1 MG tablet Take 2 tablets by mouth daily. 07/15/19   [provider]  Multiple Vitamins-Minerals (MEGA MULTIVITAMIN FOR WOMEN) TABS Take 1 tablet by mouth daily.    [provider]    Family History Family History  Problem Relation Age of Onset   Cancer Mother 61       breast   Hypertension Mother    Diabetes Mother    Breast cancer Mother    Stroke Paternal Grandmother    Stroke Maternal Grandmother    Asthma Son     Social History Social History   Tobacco Use   Smoking status: Never   Smokeless tobacco: Never  Vaping Use   Vaping Use:  Never used  Substance Use Topics   Alcohol use: Yes    Comment: occasionally   Drug use: No     Allergies   Amoxicillin and Penicillins   Review of Systems Review of Systems Per HPI  Physical Exam Triage Vital Signs ED Triage Vitals  Enc Vitals Group     BP 05/22/22 1147 129/85     Pulse Rate 05/22/22 1147 75     Resp 05/22/22 1147 18     Temp 05/22/22 1147 98.8 F (37.1 C)     Temp Source 05/22/22 1147 Oral     SpO2 05/22/22 1147 98 %     Weight --      Height --      Head Circumference --      Peak Flow --      Pain Score 05/22/22 1151 0     Pain Loc --      Pain Edu? --      Excl. in [place]? --    No data found.  Updated Vital Signs BP 129/85 (BP Location: Right Arm)   Pulse 75   Temp 98.8 F (37.1 C) (Oral)   Resp 18   SpO2 98%   Visual Acuity Right Eye Distance:   Left Eye Distance:   Bilateral Distance:    Right Eye Near:   Left Eye Near:    Bilateral Near:     Physical Exam Vitals and nursing note reviewed.  Constitutional:      General: She is not in acute distress.    Appearance: Normal appearance. She is well-developed.  HENT:     Head: Normocephalic and atraumatic.     Right Ear: Tympanic membrane, ear canal and external ear normal.     Left Ear: Tympanic membrane, ear canal and external ear normal.     Nose: Congestion and rhinorrhea present.     Mouth/Throat:     Mouth: Mucous membranes are moist.     Pharynx: Posterior oropharyngeal erythema present.  Eyes:     Extraocular Movements: Extraocular movements intact.     Conjunctiva/sclera: Conjunctivae normal.     Pupils: Pupils are equal, round, and reactive to light.  Cardiovascular:     Rate and Rhythm: Normal rate and regular rhythm.     Heart sounds: No murmur heard. Pulmonary:     Effort: Pulmonary effort is normal. No respiratory distress.     Breath sounds: Normal breath sounds.  Abdominal:     General: Bowel sounds are normal.     Palpations: Abdomen is soft.      Tenderness: There is no abdominal tenderness.  Musculoskeletal:        General:  No swelling.     Cervical back: Normal range of motion.  Skin:    General: Skin is warm and dry.     Capillary Refill: Capillary refill takes less than 2 seconds.  Neurological:     General: No focal deficit present.     Mental Status: She is alert and oriented to person, place, and time.  Psychiatric:        Mood and Affect: Mood normal.        Behavior: Behavior normal.      UC Treatments / Results  Labs (all labs ordered are listed, but only abnormal results are displayed) Labs Reviewed - No data to display  EKG   Radiology DG Chest 2 View  Result Date: 05/22/2022 CLINICAL DATA:  Short of breath, wheezing, cough EXAM: CHEST - 2 VIEW COMPARISON:  None Available. FINDINGS: The heart size and mediastinal contours are within normal limits. Both lungs are clear. The visualized skeletal structures are unremarkable. IMPRESSION: No active cardiopulmonary disease. Electronically Signed   By: Jacqulynn Cadet M.D.   On: 05/22/2022 12:16    Procedures Procedures (including critical care time)  Medications Ordered in UC Medications - No data to display  Initial Impression / Assessment and Plan / UC Course  I have reviewed the triage vital signs and the nursing notes.  Pertinent labs & imaging results that were available during my care of the patient were reviewed by me and considered in my medical decision making (see chart for details).  The patient is well-appearing, she is in no acute distress, vital signs are stable.  X-ray is negative for any active cardiopulmonary disease.  Viral testing was not indicated as this will not change the course of treatment, patient also with 2 home COVID test which were negative.  Symptoms appear to be consistent with acute bronchitis given the persistent cough, wheezing, and shortness of breath.  Patient also appears to have symptoms of allergic rhinitis.  Will  provide symptomatic treatment with prednisone 40 mg for the next 5 days to help with her cough along with Promethazine DM.  Albuterol inhaler was also prescribed to help with patient shortness of breath.  For her nasal congestion, fluticasone 50 mcg nasal spray was prescribed.  Supportive care recommendations were provided to the patient to include increasing fluids, allowing for plenty of rest, warm salt water gargles for throat discomfort, and normal saline nasal spray.  Discussed viral etiology with the patient and the duration of her symptoms.  Patient is in agreement with this plan of care and verbalizes understanding.  All questions were answered.  Patient stable for discharge.   Final Clinical Impressions(s) / UC Diagnoses   Final diagnoses:  Allergic rhinitis, unspecified seasonality, unspecified trigger  Acute bronchitis, unspecified organism     Discharge Instructions      The chest x-ray was negative for pneumonia. Take medication as prescribed.  Increase fluids and allow for plenty of rest. Recommend considering taking an antihistamine daily such as Zyrtec, Claritin, or Allegra. Normal saline nasal spray to help with nasal congestion and runny nose. Recommend use of a humidifier in your bedroom at nighttime during sleep and sleeping elevated on pillows while cough symptoms persist. Warm salt water gargles 3-4 times daily to help with throat pain or discomfort. Please be advised that your cough may continue to persist for the next few weeks.  If you are feeling well, with no fever, chills, shortness of breath, difficulty breathing, but continues to have a nagging  cough, recommend over-the-counter throat lozenges or cough drops along with increasing your fluid intake. If you develop a cough with worsening shortness of breath, difficulty breathing, with new symptoms of fever, or other concerns, please follow-up in this clinic or with your primary care physician for further  evaluation. Follow-up as needed.     ED Prescriptions     Medication Sig Dispense Auth. Provider   predniSONE (DELTASONE) 20 MG tablet Take 2 tablets (40 mg total) by mouth daily with breakfast for 5 days. 10 tablet Kennet Mccort-Warren, Alda Lea, NP   albuterol (VENTOLIN HFA) 108 (90 Base) MCG/ACT inhaler Inhale 2 puffs into the lungs every 6 (six) hours as needed for wheezing or shortness of breath. 8 g Brennen Camper-Warren, Alda Lea, NP   promethazine-dextromethorphan (PROMETHAZINE-DM) 6.25-15 MG/5ML syrup Take 5 mLs by mouth 4 (four) times daily as needed for cough. 118 mL Keisuke Hollabaugh-Warren, Alda Lea, NP   fluticasone (FLONASE) 50 MCG/ACT nasal spray Place 2 sprays into both nostrils daily. 16 g Stanislawa Gaffin-Warren, Alda Lea, NP      PDMP not reviewed this encounter.   Tish Men, NP 05/22/22 1233

## 2022-06-03 ENCOUNTER — Other Ambulatory Visit (HOSPITAL_COMMUNITY): Payer: Self-pay | Admitting: Family Medicine

## 2022-06-03 DIAGNOSIS — Z1231 Encounter for screening mammogram for malignant neoplasm of breast: Secondary | ICD-10-CM

## 2022-06-12 ENCOUNTER — Ambulatory Visit (HOSPITAL_COMMUNITY)
Admission: RE | Admit: 2022-06-12 | Discharge: 2022-06-12 | Disposition: A | Discharge status: Home / Self Care | Payer: No Typology Code available for payment source | Source: Ambulatory Visit | Attending: Family Medicine | Admitting: Family Medicine

## 2022-06-12 DIAGNOSIS — Z1231 Encounter for screening mammogram for malignant neoplasm of breast: Secondary | ICD-10-CM | POA: Diagnosis present

## 2022-07-08 ENCOUNTER — Encounter (INDEPENDENT_AMBULATORY_CARE_PROVIDER_SITE_OTHER): Payer: Self-pay | Admitting: *Deleted

## 2022-08-07 ENCOUNTER — Telehealth (INDEPENDENT_AMBULATORY_CARE_PROVIDER_SITE_OTHER): Payer: Self-pay | Admitting: Gastroenterology

## 2022-08-07 NOTE — Telephone Encounter (Signed)
Any room, Thanks 

## 2022-08-07 NOTE — Telephone Encounter (Signed)
[facility]  Reasons for the colonoscopy: Screening  Have you had a colonoscopy before?  No  Do you have family history of colon cancer? no  Previous colonoscopy with polyps removed? no  Do you have a history colorectal cancer?   no  Are you diabetic? If yes, Type 1 or Type 2?    no  Do you have a prosthetic or mechanical heart valve? no  Do you have a pacemaker/defibrillator?   no  Have you had endocarditis/atrial fibrillation? no  Have you had joint replacement within the last 12 months?  no  Do you tend to be constipated or have to use laxatives? no  Do you have any history of drugs or alchohol?  no  Do you use supplemental oxygen?  no  Have you had a stroke or heart attack within the last 6 months?no  Do you take weight loss medication? no  For female patients: have you had a hysterectomy?  no                                     are you post menopausal?       no                                            do you still have your menstrual cycle? yes      Do you take any blood-thinning medications such as: (aspirin, warfarin, Plavix, Aggrenox)  no  If yes we need the name, milligram, dosage and who is prescribing doctor  Current Outpatient Medications on File Prior to Visit  Medication Sig Dispense Refill   albuterol (VENTOLIN HFA) 108 (90 Base) MCG/ACT inhaler Inhale 2 puffs into the lungs every 6 (six) hours as needed for wheezing or shortness of breath. 8 g 0   aspirin 500 MG tablet Take 500 mg by mouth as needed for pain.     Cholecalciferol (VITAMIN D PO) Take by mouth daily.     cyclobenzaprine (FLEXERIL) 10 MG tablet Take 1 tablet (10 mg total) by mouth 3 (three) times daily as needed for muscle spasms. Do not drink alcohol or drive while taking these medications.  May cause drowsiness. 15 tablet 0   dextromethorphan-guaiFENesin (MUCINEX DM) 30-600 MG 12hr tablet Take 1 tablet by mouth 2 (two) times daily.     diphenhydrAMINE-PSE-APAP  (BENADRYL COLD PO) Take by mouth.     fluticasone (FLONASE) 50 MCG/ACT nasal spray Place 2 sprays into both nostrils daily. 16 g 0   folic acid (FOLVITE) 1 MG tablet Take 2 tablets by mouth daily.     Multiple Vitamins-Minerals (MEGA MULTIVITAMIN FOR WOMEN) TABS Take 1 tablet by mouth daily.     promethazine-dextromethorphan (PROMETHAZINE-DM) 6.25-15 MG/5ML syrup Take 5 mLs by mouth 4 (four) times daily as needed for cough. 118 mL 0   No current facility-administered medications on file prior to visit.    Allergies  Allergen Reactions   Amoxicillin Itching and Rash   Penicillins Itching and Rash    Has patient had a PCN reaction causing immediate rash, facial/tongue/throat swelling, SOB or lightheadedness with hypotension: no Has patient had a PCN reaction causing severe rash involving mucus membranes or skin necrosis: no Has patient had a PCN reaction that required hospitalization: no Has  patient had a PCN reaction occurring within the last 10 years: no If all of the above answers are "NO", then may proceed with Cephalosporin use.      Pharmacy: [facility]   Primary Insurance Name: [place] (referral effective 06/27/22-09/25/22)  Best number where you can be reached: [phone]

## 2022-08-08 NOTE — Telephone Encounter (Signed)
Left message to return call 

## 2022-08-12 MED ORDER — PEG 3350-KCL-NA BICARB-NACL 420 G PO SOLR: 4000.0000 mL | Freq: Once | ORAL | 0 refills | Status: AC

## 2022-08-12 NOTE — Telephone Encounter (Signed)
Pt contacted. Pt has been scheduled for 09/12/22 at 1:30 pm. Instructions mailed to patient. Prep sent to pharmacy

## 2022-08-12 NOTE — Addendum Note (Signed)
Addended by: Marlowe Shores on: 08/12/2022 12:20 PM   Modules accepted: Orders

## 2022-08-13 NOTE — Telephone Encounter (Signed)
Here I the [place] auth for procedure - I don't think I gave it to you with questionnaire  [place] Berkley Harvey [id number] Effective 06/27/22-09/25/22

## 2022-09-08 ENCOUNTER — Telehealth (INDEPENDENT_AMBULATORY_CARE_PROVIDER_SITE_OTHER): Payer: Self-pay | Admitting: Gastroenterology

## 2022-09-08 NOTE — Telephone Encounter (Signed)
Please call Suzie at the [facility] regarding this patient - wanting to reschedule her colonoscopy for 6/28 - ph# [phone] ext [phone]

## 2022-09-08 NOTE — Telephone Encounter (Signed)
Left message to return call 

## 2022-09-09 ENCOUNTER — Other Ambulatory Visit (INDEPENDENT_AMBULATORY_CARE_PROVIDER_SITE_OTHER): Payer: Self-pay | Admitting: Gastroenterology

## 2022-09-09 DIAGNOSIS — Z1211 Encounter for screening for malignant neoplasm of colon: Secondary | ICD-10-CM

## 2022-09-10 NOTE — Telephone Encounter (Signed)
Pt left voicemail that she needs to reschedule [facility] that is on for 09/12/22 with Dr.Castaneda due to out of town emergency. Pt stated if we had something in August that would be great.   Returned call to patient but had to leave detailed message on voicemail

## 2022-09-12 ENCOUNTER — Ambulatory Visit (HOSPITAL_COMMUNITY): Admit: 2022-09-12 | Payer: 59 | Admitting: Gastroenterology

## 2022-09-12 ENCOUNTER — Encounter (HOSPITAL_COMMUNITY): Payer: Self-pay

## 2022-09-12 ENCOUNTER — Telehealth (INDEPENDENT_AMBULATORY_CARE_PROVIDER_SITE_OTHER): Payer: Self-pay | Admitting: Gastroenterology

## 2022-09-12 ENCOUNTER — Encounter (INDEPENDENT_AMBULATORY_CARE_PROVIDER_SITE_OTHER): Payer: Self-pay

## 2022-09-12 SURGERY — COLONOSCOPY WITH PROPOFOL
Anesthesia: Monitor Anesthesia Care

## 2022-09-12 NOTE — Telephone Encounter (Signed)
Suzie returned call. Suzie asked what we had for later in July. Artemio Aly we have 7/29 and 10/15/22. Suzie asked that I put pt on for 10/15/22 at 10am. Suzie will send letter out to patient. Also will be sending over new referral information.  Message sent to Endo with new date and time. Updated instructions will be mailed to patient. Lab order for pregnancy test in also.

## 2022-09-12 NOTE — Telephone Encounter (Signed)
error 

## 2022-10-10 ENCOUNTER — Other Ambulatory Visit (HOSPITAL_COMMUNITY)
Admission: RE | Admit: 2022-10-10 | Discharge: 2022-10-10 | Disposition: A | Discharge status: Home / Self Care | Payer: No Typology Code available for payment source | Source: Ambulatory Visit | Attending: Gastroenterology | Admitting: Gastroenterology

## 2022-10-10 DIAGNOSIS — Z1211 Encounter for screening for malignant neoplasm of colon: Secondary | ICD-10-CM | POA: Insufficient documentation

## 2022-10-10 LAB — PREGNANCY, URINE: Preg Test, Ur: NEGATIVE

## 2022-10-15 ENCOUNTER — Ambulatory Visit (HOSPITAL_COMMUNITY): Payer: No Typology Code available for payment source | Admitting: Anesthesiology

## 2022-10-15 ENCOUNTER — Encounter (HOSPITAL_COMMUNITY)
Admission: RE | Disposition: A | Discharge status: Home / Self Care | Payer: Self-pay | Source: Home / Self Care | Attending: Gastroenterology

## 2022-10-15 ENCOUNTER — Ambulatory Visit (HOSPITAL_COMMUNITY)
Admission: RE | Admit: 2022-10-15 | Discharge: 2022-10-15 | Disposition: A | Discharge status: Home / Self Care | Payer: No Typology Code available for payment source | Attending: Gastroenterology | Admitting: Gastroenterology

## 2022-10-15 ENCOUNTER — Other Ambulatory Visit: Payer: Self-pay

## 2022-10-15 ENCOUNTER — Encounter (HOSPITAL_COMMUNITY): Payer: Self-pay | Admitting: Gastroenterology

## 2022-10-15 ENCOUNTER — Encounter (INDEPENDENT_AMBULATORY_CARE_PROVIDER_SITE_OTHER): Payer: Self-pay | Admitting: *Deleted

## 2022-10-15 DIAGNOSIS — K648 Other hemorrhoids: Secondary | ICD-10-CM | POA: Diagnosis not present

## 2022-10-15 DIAGNOSIS — D123 Benign neoplasm of transverse colon: Secondary | ICD-10-CM | POA: Diagnosis not present

## 2022-10-15 DIAGNOSIS — D126 Benign neoplasm of colon, unspecified: Secondary | ICD-10-CM

## 2022-10-15 DIAGNOSIS — Z1211 Encounter for screening for malignant neoplasm of colon: Secondary | ICD-10-CM | POA: Insufficient documentation

## 2022-10-15 HISTORY — PX: POLYPECTOMY: SHX5525

## 2022-10-15 HISTORY — PX: COLONOSCOPY WITH PROPOFOL: SHX5780

## 2022-10-15 LAB — HM COLONOSCOPY

## 2022-10-15 SURGERY — COLONOSCOPY WITH PROPOFOL
Anesthesia: General

## 2022-10-15 MED ORDER — LACTATED RINGERS IV SOLN: INTRAVENOUS | Status: DC

## 2022-10-15 MED ORDER — PROPOFOL 10 MG/ML IV BOLUS
INTRAVENOUS | Status: DC | PRN
  Administered 2022-10-15: 100 mg via INTRAVENOUS
  Administered 2022-10-15: 20 mg via INTRAVENOUS

## 2022-10-15 MED ORDER — PROPOFOL 500 MG/50ML IV EMUL
INTRAVENOUS | Status: DC | PRN
  Administered 2022-10-15: 150 ug/kg/min via INTRAVENOUS

## 2022-10-15 MED ORDER — LIDOCAINE HCL 1 % IJ SOLN
INTRAMUSCULAR | Status: DC | PRN
  Administered 2022-10-15: 50 mg via INTRADERMAL

## 2022-10-15 NOTE — Anesthesia Postprocedure Evaluation (Signed)
Anesthesia Post Note  Patient: Theresa Farrell  Procedure(s) Performed: COLONOSCOPY WITH PROPOFOL POLYPECTOMY  Patient location during evaluation: Endoscopy Anesthesia Type: General Level of consciousness: awake and alert Pain management: pain level controlled Vital Signs Assessment: post-procedure vital signs reviewed and stable Respiratory status: spontaneous breathing Cardiovascular status: blood pressure returned to baseline and stable Postop Assessment: no apparent nausea or vomiting Anesthetic complications: no   No notable events documented.   Last Vitals:  Vitals:   10/15/22 0845 10/15/22 1030  BP: 109/70 117/66  Pulse: 72 80  Resp: (!) 1 18  Temp: 36.7 C 36.4 C  SpO2: 100% 100%    Last Pain:  Vitals:   10/15/22 1030  TempSrc: Oral  PainSc: 0-No pain                 Shantay Sonn

## 2022-10-15 NOTE — Discharge Instructions (Addendum)
You are being discharged to home.  Resume your previous diet.  Your physician has recommended a repeat colonoscopy for surveillance based on pathology results.  We are waiting for your pathology results.

## 2022-10-15 NOTE — Anesthesia Preprocedure Evaluation (Signed)
Anesthesia Evaluation  Patient identified by MRN, date of birth, ID band Patient awake    Reviewed: Allergy & Precautions, H&P , NPO status , Patient's Chart, lab work & pertinent test results, reviewed documented beta blocker date and time   Airway Mallampati: II  TM Distance: >3 FB Neck ROM: full    Dental no notable dental hx.    Pulmonary neg pulmonary ROS   Pulmonary exam normal breath sounds clear to auscultation       Cardiovascular Exercise Tolerance: Good negative cardio ROS  Rhythm:regular Rate:Normal     Neuro/Psych negative neurological ROS  negative psych ROS   GI/Hepatic negative GI ROS, Neg liver ROS,,,  Endo/Other  negative endocrine ROS    Renal/GU negative Renal ROS  negative genitourinary   Musculoskeletal   Abdominal   Peds  Hematology negative hematology ROS (+)   Anesthesia Other Findings   Reproductive/Obstetrics negative OB ROS                             Anesthesia Physical Anesthesia Plan  ASA: 1  Anesthesia Plan: General   Post-op Pain Management:    Induction:   PONV Risk Score and Plan: Propofol infusion  Airway Management Planned:   Additional Equipment:   Intra-op Plan:   Post-operative Plan:   Informed Consent: I have reviewed the patients History and Physical, chart, labs and discussed the procedure including the risks, benefits and alternatives for the proposed anesthesia with the patient or authorized representative who has indicated his/her understanding and acceptance.     Dental Advisory Given  Plan Discussed with: CRNA  Anesthesia Plan Comments:        Anesthesia Quick Evaluation  

## 2022-10-15 NOTE — Transfer of Care (Signed)
Immediate Anesthesia Transfer of Care Note  Patient: Theresa Farrell  Procedure(s) Performed: COLONOSCOPY WITH PROPOFOL POLYPECTOMY  Patient Location: Endoscopy Unit  Anesthesia Type:General  Level of Consciousness: awake  Airway & Oxygen Therapy: Patient Spontanous Breathing  Post-op Assessment: Report given to RN  Post vital signs: Reviewed and stable  Last Vitals:  Vitals Value Taken Time  BP    Temp    Pulse    Resp    SpO2      Last Pain:  Vitals:   10/15/22 0953  TempSrc:   PainSc: 0-No pain         Complications: No notable events documented.

## 2022-10-15 NOTE — H&P (Signed)
Theresa Farrell is an 47 y.o. female.   Chief Complaint: Cervical cancer screening HPI: 47 year old female, coming for screening colonoscopy. The patient has never had a colonoscopy in the past.  The patient denies having any complaints such as melena, hematochezia, abdominal pain or distention, change in her bowel movement consistency or frequency, no changes in weight recently.  No family history of colorectal cancer.   Past Medical History:  Diagnosis Date   Atypical squamous cell changes of undetermined significance (ASCUS) on cervical cytology with negative high risk human papilloma virus (HPV) test result 05/06/2017   Herpes    seropositive    Past Surgical History:  Procedure Laterality Date   TUBAL LIGATION N/A 03/25/2013   Procedure: POST PARTUM TUBAL LIGATION;  Surgeon: Adam Phenix, MD;  Location: [facility];  Service: Gynecology;  Laterality: N/A;   WISDOM TOOTH EXTRACTION      Family History  Problem Relation Age of Onset   Cancer Mother 23       breast   Hypertension Mother    Diabetes Mother    Breast cancer Mother    Stroke Paternal Grandmother    Stroke Maternal Grandmother    Asthma Son    Social History:  reports that she has never smoked. She has never used smokeless tobacco. She reports current alcohol use. She reports that she does not use drugs.  Allergies:  Allergies  Allergen Reactions   Amoxicillin Itching and Rash   Penicillins Itching and Rash         Medications Prior to Admission  Medication Sig Dispense Refill   diphenhydrAMINE (BENADRYL) 25 MG tablet Take 25 mg by mouth daily as needed for itching.     Multiple Vitamins-Minerals (MEGA MULTIVITAMIN FOR WOMEN) TABS Take 1 tablet by mouth daily.     polyethylene glycol-electrolytes (NULYTELY) 420 g solution Take 4,000 mLs by mouth.     Semaglutide-Weight Management (WEGOVY) 0.25 MG/0.5ML SOAJ Inject 0.25 mg into the skin every Monday.      No results found for this or any previous visit (from  the past 48 hour(s)). No results found.  Review of Systems  All other systems reviewed and are negative.   Blood pressure 109/70, pulse 72, temperature 98.1 F (36.7 C), temperature source Oral, resp. rate (!) 1, height 5\' 4"  (1.626 m), weight 82.1 kg, last menstrual period 10/06/2022, SpO2 100%. Physical Exam  GENERAL: The patient is AO x3, in no acute distress. HEENT: Head is normocephalic and atraumatic. EOMI are intact. Mouth is well hydrated and without lesions. NECK: Supple. No masses LUNGS: Clear to auscultation. No presence of rhonchi/wheezing/rales. Adequate chest expansion HEART: RRR, normal s1 and s2. ABDOMEN: Soft, nontender, no guarding, no peritoneal signs, and nondistended. BS +. No masses. EXTREMITIES: Without any cyanosis, clubbing, rash, lesions or edema. NEUROLOGIC: AOx3, no focal motor deficit. SKIN: no jaundice, no rashes  Assessment/Plan 47 year old female, coming for screening colonoscopy. The patient is at average risk for colorectal cancer.  We will proceed with colonoscopy today.   Dolores Frame, MD 10/15/2022, 9:48 AM

## 2022-10-15 NOTE — Op Note (Signed)
[facility] Patient Name: Theresa Farrell Procedure Date: 10/15/2022 9:39 AM MRN: [id number] Date of Birth: 1975/07/09 Attending MD: Katrinka Blazing , , [id number] CSN: [id number] Age: 47 Admit Type: Outpatient Procedure:                Colonoscopy Indications:              Screening for colorectal malignant neoplasm Providers:                Katrinka Blazing, Sheran Fava, Kristine L.                            Jessee Avers, Technician Referring MD:              Medicines:                Monitored Anesthesia Care Complications:            No immediate complications. Estimated Blood Loss:     Estimated blood loss: none. Procedure:                Pre-Anesthesia Assessment:                           - Prior to the procedure, a History and Physical                            was performed, and patient medications, allergies                            and sensitivities were reviewed. The patient's                            tolerance of previous anesthesia was reviewed.                           - The risks and benefits of the procedure and the                            sedation options and risks were discussed with the                            patient. All questions were answered and informed                            consent was obtained.                           - ASA Grade Assessment: I - A normal, healthy                            patient.                           After obtaining informed consent, the colonoscope                            was passed under direct vision. Throughout the  procedure, the patient's blood pressure, pulse, and                            oxygen saturations were monitored continuously. The                            [id number]-HQ190L ([id number]) scope was introduced through                            the anus and advanced to the the cecum, identified                            by appendiceal orifice and ileocecal valve. The                             colonoscopy was performed without difficulty. The                            patient tolerated the procedure well. The quality                            of the bowel preparation was adequate. Scope In: 9:59:28 AM Scope Out: 10:26:57 AM Scope Withdrawal Time: 0 hours 19 minutes 46 seconds  Total Procedure Duration: 0 hours 27 minutes 29 seconds  Findings:      The perianal and digital rectal examinations were normal.      A 1 mm polyp was found in the transverse colon. The polyp was sessile.       The polyp was removed with a cold biopsy forceps. Resection and       retrieval were complete.      A 4 mm polyp was found in the transverse colon. The polyp was sessile.       The polyp was removed with a cold snare. Resection and retrieval were       complete.      Non-bleeding internal hemorrhoids were found during retroflexion. The       hemorrhoids were small. Impression:               - One 1 mm polyp in the transverse colon, removed                            with a cold biopsy forceps. Resected and retrieved.                           - One 4 mm polyp in the transverse colon, removed                            with a cold snare. Resected and retrieved.                           - Non-bleeding internal hemorrhoids. Moderate Sedation:      Per Anesthesia Care Recommendation:           - Discharge patient to home (ambulatory).                           -  Resume previous diet.                           - Repeat colonoscopy for surveillance based on                            pathology results.                           - Await pathology results. Procedure Code(s):        --- Professional ---                           [phone], Colonoscopy, flexible; with removal of                            tumor(s), polyp(s), or other lesion(s) by snare                            technique                           45380, 59, Colonoscopy, flexible; with biopsy,                             single or multiple Diagnosis Code(s):        --- Professional ---                           Z12.11, Encounter for screening for malignant                            neoplasm of colon                           D12.3, Benign neoplasm of transverse colon (hepatic                            flexure or splenic flexure)                           K64.8, Other hemorrhoids CPT copyright 2022 American Medical Association. All rights reserved. The codes documented in this report are preliminary and upon coder review may  be revised to meet current compliance requirements. Katrinka Blazing, MD Katrinka Blazing,  10/15/2022 10:35:27 AM This report has been signed electronically. Number of Addenda: 0

## 2022-10-17 ENCOUNTER — Encounter (INDEPENDENT_AMBULATORY_CARE_PROVIDER_SITE_OTHER): Payer: Self-pay | Admitting: *Deleted

## 2022-10-17 ENCOUNTER — Encounter (HOSPITAL_COMMUNITY): Payer: Self-pay | Admitting: Gastroenterology

## 2023-05-25 ENCOUNTER — Other Ambulatory Visit (HOSPITAL_COMMUNITY): Payer: Self-pay | Admitting: Internal Medicine

## 2023-05-25 DIAGNOSIS — Z1231 Encounter for screening mammogram for malignant neoplasm of breast: Secondary | ICD-10-CM

## 2023-06-15 ENCOUNTER — Ambulatory Visit (HOSPITAL_COMMUNITY)
Admission: RE | Admit: 2023-06-15 | Discharge: 2023-06-15 | Disposition: A | Discharge status: Home / Self Care | Source: Ambulatory Visit | Attending: Internal Medicine | Admitting: Internal Medicine

## 2023-06-15 DIAGNOSIS — Z1231 Encounter for screening mammogram for malignant neoplasm of breast: Secondary | ICD-10-CM | POA: Insufficient documentation

## 2023-08-04 ENCOUNTER — Encounter: Payer: Self-pay | Admitting: Women's Health

## 2023-08-04 ENCOUNTER — Other Ambulatory Visit (HOSPITAL_COMMUNITY)
Admission: RE | Admit: 2023-08-04 | Discharge: 2023-08-04 | Disposition: A | Discharge status: Home / Self Care | Source: Ambulatory Visit | Attending: Women's Health | Admitting: Women's Health

## 2023-08-04 ENCOUNTER — Ambulatory Visit (INDEPENDENT_AMBULATORY_CARE_PROVIDER_SITE_OTHER): Admitting: Women's Health

## 2023-08-04 VITALS — BP 110/72 | HR 61 | Ht 64.0 in | Wt 161.0 lb

## 2023-08-04 DIAGNOSIS — Z803 Family history of malignant neoplasm of breast: Secondary | ICD-10-CM

## 2023-08-04 DIAGNOSIS — Z01419 Encounter for gynecological examination (general) (routine) without abnormal findings: Secondary | ICD-10-CM | POA: Insufficient documentation

## 2023-08-04 DIAGNOSIS — N898 Other specified noninflammatory disorders of vagina: Secondary | ICD-10-CM

## 2023-08-04 NOTE — Progress Notes (Signed)
 WELL-WOMAN EXAMINATION Patient name: Theresa Farrell MRN [id number]  Date of birth: 1976-02-01 Chief Complaint:   Annual Exam  History of Present Illness:   BRYNLEI Theresa Farrell is a 48 y.o. [id number] African-American female being seen today for a routine well-woman exam. Some vag dryness. Periods regular, have gone from 5d to 3d. Mom was in 60s when she went through menopause, older sister is 79 and going through it now.  States just found out mom had cervical cancer in her 73s and had to have it removed (discussed could have been HSIL)  [facility]      does not desire labs Patient's last menstrual period was 07/30/2023. The current method of family planning is tubal ligation.  Last pap 03/14/21. Results were: NILM w/ HRHPV negative. H/O abnormal pap: yes 2019 ASCUS w/ -HRHPV Last mammogram: 06/15/23. Results were: normal. Family h/o breast cancer: yes mom dx @ 43, just came back, had lumpectomy 2wk ago-on pill only Last colonoscopy: 10/15/22. Results were: abnormal :polyps-tubular adenoma, per report f/u [facility] 38yrs. Family h/o colorectal cancer: no     08/04/2023   11:19 AM 03/14/2021    9:42 AM 04/30/2017    1:43 PM  Depression screen PHQ 2/9  Decreased Interest 0 1 0  Down, Depressed, Hopeless 0 0 0  PHQ - 2 Score 0 1 0  Altered sleeping 0 0   Tired, decreased energy 0 2   Change in appetite 0 1   Feeling bad or failure about yourself  0 0   Trouble concentrating 1 0   Moving slowly or fidgety/restless 0 1   Suicidal thoughts 0 0   PHQ-9 Score 1 5         08/04/2023   11:19 AM 03/14/2021    9:42 AM  GAD 7 : Generalized Anxiety Score  Nervous, Anxious, on Edge 0 0  Control/stop worrying 0 0  Worry too much - different things 0 0  Trouble relaxing 0 0  Restless 0 0  Easily annoyed or irritable 0 1  Afraid - awful might happen 0 1  Total GAD 7 Score 0 2     Review of Systems:   Pertinent items are noted in HPI Denies any headaches, blurred vision, fatigue, shortness of  breath, chest pain, abdominal pain, abnormal vaginal discharge/itching/odor/irritation, problems with periods, bowel movements, urination, or intercourse unless otherwise stated above. Pertinent History Reviewed:  Reviewed past medical,surgical, social and family history.  Reviewed problem list, medications and allergies. Physical Assessment:   Vitals:   08/04/23 1117  BP: 110/72  Pulse: 61  Weight: 161 lb (73 kg)  Height: 5\' 4"  (1.626 m)  Body mass index is 27.64 kg/m.        Physical Examination:   General appearance - well appearing, and in no distress  Mental status - alert, oriented to person, place, and time  Psych:  She has a normal mood and affect  Skin - warm and dry, normal color, no suspicious lesions noted  Chest - effort normal, all lung fields clear to auscultation bilaterally  Heart - normal rate and regular rhythm  Neck:  midline trachea, no thyromegaly or nodules  Breasts - breasts appear normal, no suspicious masses, no skin or nipple changes or  axillary nodes  Abdomen - soft, nontender, nondistended, no masses or organomegaly  Pelvic - VULVA: normal appearing vulva with no masses, tenderness or lesions  VAGINA: normal appearing vagina with normal color and discharge, no lesions  CERVIX: normal  appearing cervix without discharge or lesions, no CMT  Thin prep pap is done w/ HR HPV cotesting  UTERUS: uterus is felt to be normal size, shape, consistency and nontender   ADNEXA: No adnexal masses or tenderness noted.  Extremities:  No swelling or varicosities noted  Chaperone: Latisha Cresenzo  No results found for this or any previous visit (from the past 24 hours).  Assessment & Plan:  1) Well-Woman Exam  2) Family h/o breast cancer in mom> dx in 36s, continue yearly mammos  3) Shorter periods w/ vag dryness> discussed can be starting perimenopause, can use lubricant prn  Labs/procedures today: pap  Mammogram: 34yr from last, or sooner if  problems Colonoscopy: per GI, or sooner if problems  No orders of the defined types were placed in this encounter.   Meds: No orders of the defined types were placed in this encounter.   Follow-up: Return in about 1 year (around 08/03/2024) for Physical.  Ferd Householder CNM, [facility]NP-BC 08/04/2023 11:46 AM

## 2023-08-04 NOTE — Addendum Note (Signed)
 Addended by: Myrl Askew on: 08/04/2023 12:04 PM   Modules accepted: Orders

## 2023-08-12 ENCOUNTER — Ambulatory Visit: Payer: Self-pay | Admitting: Women's Health

## 2023-08-12 LAB — CYTOLOGY - PAP
Chlamydia: NEGATIVE
Comment: NEGATIVE
Comment: NEGATIVE
Comment: NEGATIVE
Comment: NORMAL
Diagnosis: NEGATIVE
High risk HPV: NEGATIVE
Neisseria Gonorrhea: NEGATIVE
Trichomonas: NEGATIVE

## 2023-11-20 ENCOUNTER — Encounter: Payer: Self-pay | Admitting: Emergency Medicine

## 2023-11-20 ENCOUNTER — Ambulatory Visit
Admission: EM | Admit: 2023-11-20 | Discharge: 2023-11-20 | Disposition: A | Discharge status: Home / Self Care | Attending: Family Medicine | Admitting: Family Medicine

## 2023-11-20 DIAGNOSIS — H1033 Unspecified acute conjunctivitis, bilateral: Secondary | ICD-10-CM

## 2023-11-20 DIAGNOSIS — J069 Acute upper respiratory infection, unspecified: Secondary | ICD-10-CM

## 2023-11-20 MED ORDER — ERYTHROMYCIN 5 MG/GM OP OINT: TOPICAL_OINTMENT | OPHTHALMIC | 0 refills | Status: AC

## 2023-11-20 MED ORDER — AZELASTINE HCL 0.1 % NA SOLN: 1.0000 | Freq: Two times a day (BID) | NASAL | 0 refills | Status: AC

## 2023-11-20 NOTE — ED Provider Notes (Signed)
 [facility]    CSN: [id number] Arrival date & time: 11/20/23  0857      History   Chief Complaint No chief complaint on file.   HPI Theresa Farrell is a 48 y.o. female.   Patient presenting today with bilateral eye pain, burning sensation, itching, clear drainage, light sensitivity, redness that started yesterday.  Denies any known injury to the eye, chemical exposures, loss of vision, headache, nausea, vomiting, fevers.  States she has been fighting off a cold for the past week and is still having nasal congestion and fatigue but otherwise feeling a bit better.  Tried some Visine antihistamine drops yesterday with mild temporary relief of symptoms.    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   Fibroid 09/14/2012    Past Surgical History:  Procedure Laterality Date   COLONOSCOPY WITH PROPOFOL  N/A 10/15/2022   Procedure: COLONOSCOPY WITH PROPOFOL ;  Surgeon: Eartha Angelia Sieving, MD;  Location: [facility];  Service: Gastroenterology;  Laterality: N/A;  10:00am;asa 1-2   POLYPECTOMY  10/15/2022   Procedure: POLYPECTOMY;  Surgeon: Eartha Angelia Sieving, MD;  Location: [facility];  Service: Gastroenterology;;   TUBAL LIGATION N/A 03/25/2013   Procedure: POST PARTUM TUBAL LIGATION;  Surgeon: Lynwood KANDICE Solomons, MD;  Location: [facility];  Service: Gynecology;  Laterality: N/A;   WISDOM TOOTH EXTRACTION      OB History     Gravida  3   Para  3   Term  3    Preterm  0   AB      Living  3      SAB      IAB      Ectopic      Multiple      Live Births  3            Home Medications    Prior to Admission medications   Medication Sig Start Date End Date Taking? Authorizing Provider  azelastine  (ASTELIN ) 0.1 % nasal spray Place 1 spray into both nostrils 2 (two) times daily. Use in each nostril as directed 11/20/23  Yes Stuart Vernell Norris, PA-C  erythromycin  ophthalmic ointment Place a 1/2 inch ribbon of ointment into the lower eyelids b/l BID prn. 11/20/23  Yes Stuart Vernell Norris, PA-C  diphenhydrAMINE  (BENADRYL ) 25 MG tablet Take 25 mg by mouth daily as needed for itching.    [provider]  Multiple Vitamins-Minerals (MEGA MULTIVITAMIN FOR WOMEN) TABS Take 1 tablet by mouth daily.    [provider]  Semaglutide-Weight Management (WEGOVY) 0.25 MG/0.5ML SOAJ Inject 0.25 mg into the skin every Monday. Patient not taking: Reported on 08/04/2023    [provider]    Family History Family History  Problem Relation Age of Onset   Stroke Paternal Grandmother    Stroke Maternal Grandmother    Heart attack Father    Congenital heart disease Father    Cancer Mother 49       breast   Hypertension Mother  Diabetes Mother    Breast cancer Mother    Asthma Son     Social History Social History   Tobacco Use   Smoking status: Never   Smokeless tobacco: Never  Vaping Use   Vaping status: Never Used  Substance Use Topics   Alcohol use: Yes    Comment: occasionally   Drug use: No     Allergies   Amoxicillin and Penicillins   Review of Systems Review of Systems Per HPI  Physical Exam Triage Vital Signs ED Triage Vitals  Encounter Vitals Group     BP 11/20/23 0902 128/71     Girls Systolic BP Percentile --      Girls Diastolic BP Percentile --      Boys Systolic BP Percentile --      Boys Diastolic BP Percentile --      Pulse Rate 11/20/23 0902 70     Resp 11/20/23 0902 18      Temp 11/20/23 0902 98.5 F (36.9 C)     Temp Source 11/20/23 0902 Oral     SpO2 11/20/23 0902 99 %     Weight --      Height --      Head Circumference --      Peak Flow --      Pain Score 11/20/23 0906 9     Pain Loc --      Pain Education --      Exclude from Growth Chart --    No data found.  Updated Vital Signs BP 128/71 (BP Location: Right Arm)   Pulse 70   Temp 98.5 F (36.9 C) (Oral)   Resp 18   LMP 11/15/2023 (Exact Date)   SpO2 99%   Visual Acuity Right Eye Distance:   Left Eye Distance:   Bilateral Distance:    Right Eye Near:   Left Eye Near:    Bilateral Near:     Physical Exam Vitals and nursing note reviewed.  Constitutional:      Appearance: Normal appearance. She is not ill-appearing.  HENT:     Head: Atraumatic.     Right Ear: Tympanic membrane and external ear normal.     Left Ear: Tympanic membrane and external ear normal.     Nose: Rhinorrhea present.     Mouth/Throat:     Mouth: Mucous membranes are moist.     Pharynx: Posterior oropharyngeal erythema present.  Eyes:     Extraocular Movements: Extraocular movements intact.     Pupils: Pupils are equal, round, and reactive to light.     Comments: Bilateral conjunctiva injected and erythematous, clear drainage copiously running from eyes  Cardiovascular:     Rate and Rhythm: Normal rate and regular rhythm.     Heart sounds: Normal heart sounds.  Pulmonary:     Effort: Pulmonary effort is normal.     Breath sounds: Normal breath sounds. No wheezing or rales.  Musculoskeletal:        General: Normal range of motion.     Cervical back: Normal range of motion and neck supple.  Skin:    General: Skin is warm and dry.  Neurological:     Mental Status: She is alert and oriented to person, place, and time.  Psychiatric:        Mood and Affect: Mood normal.        Thought Content: Thought content normal.        Judgment: Judgment normal.  UC Treatments / Results  Labs (all labs  ordered are listed, but only abnormal results are displayed) Labs Reviewed - No data to display  EKG   Radiology No results found.  Procedures Procedures (including critical care time)  Medications Ordered in UC Medications - No data to display  Initial Impression / Assessment and Plan / UC Course  I have reviewed the triage vital signs and the nursing notes.  Pertinent labs & imaging results that were available during my care of the patient were reviewed by me and considered in my medical decision making (see chart for details).     Possibly viral versus allergic conjunctivitis but given the extent of pain and inflammation will cover with erythromycin  ointment to prevent bacterial infection.  Unable to perform visual acuity today as she did not bring her glasses.  Will treat with Astelin  nasal spray for ongoing nasal congestion and discussed supportive over-the-counter medications, home care for both symptom sets.  Return for worsening symptoms.  Note given.  Final Clinical Impressions(s) / UC Diagnoses   Final diagnoses:  Acute conjunctivitis of both eyes, unspecified acute conjunctivitis type  Viral URI   Discharge Instructions   None    ED Prescriptions     Medication Sig Dispense Auth. Provider   erythromycin  ophthalmic ointment Place a 1/2 inch ribbon of ointment into the lower eyelids b/l BID prn. 3.5 g Stuart Vernell Norris, PA-C   azelastine  (ASTELIN ) 0.1 % nasal spray Place 1 spray into both nostrils 2 (two) times daily. Use in each nostril as directed 30 mL Stuart Vernell Norris, PA-C      PDMP not reviewed this encounter.   Stuart Vernell Norris, [place] 11/20/23 1009

## 2023-11-20 NOTE — ED Triage Notes (Addendum)
 Bilateral eye pain with blurred vision since yesterday.  State it hurts to look down and states she is very sensitive to light.  States eyes drained through the night last night.  Does not have glasses to be able to do visual acuity.

## 2023-11-22 IMAGING — DX DG LUMBAR SPINE COMPLETE 4+V
5 series · 5 of 5 positions shown · non-contrast
Comparison: None.

CLINICAL DATA: Low back pain with right-sided sciatica for several
weeks. No known injury.

EXAM:
LUMBAR SPINE - COMPLETE 4+ VIEW

[lumbar spine ap]
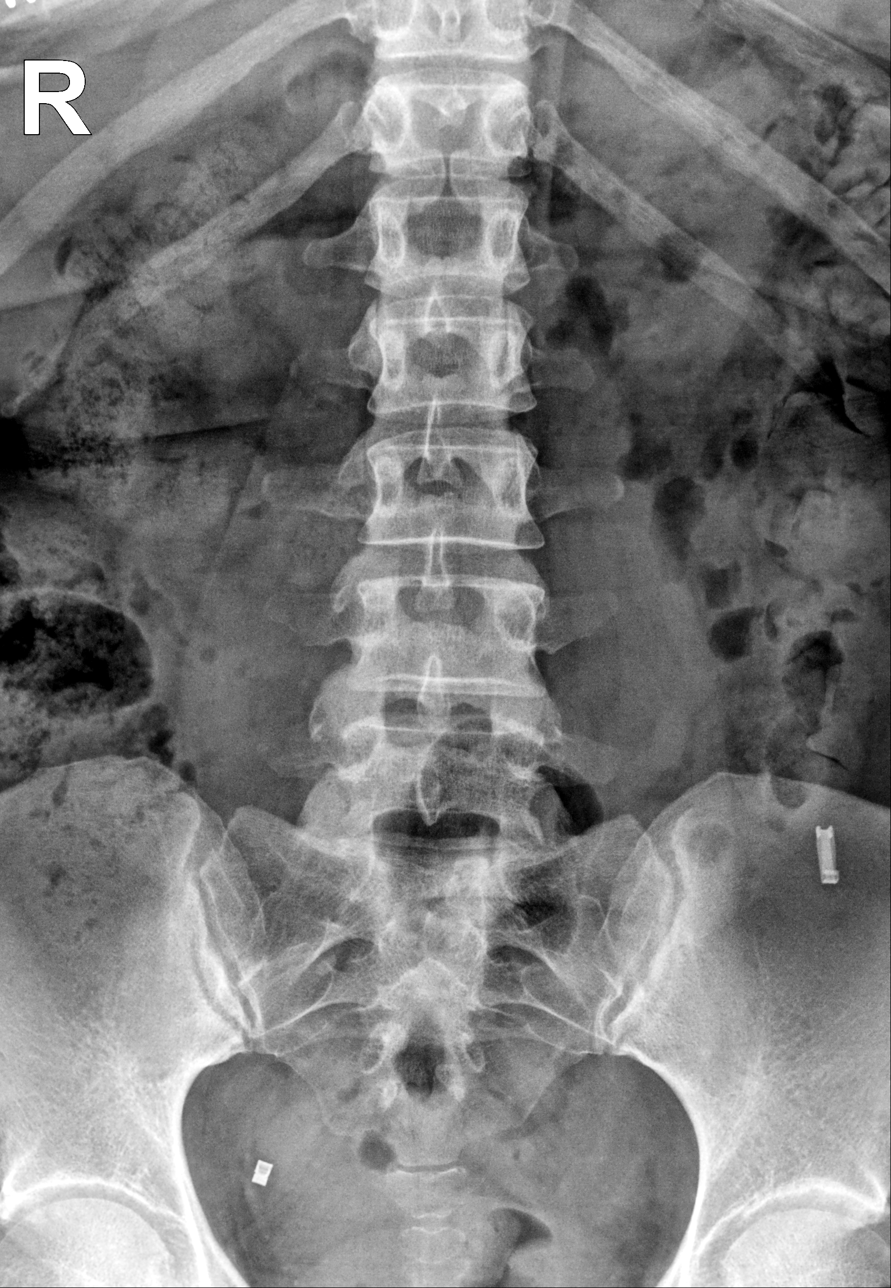

[lumbar spine mlo (1 of 2)]
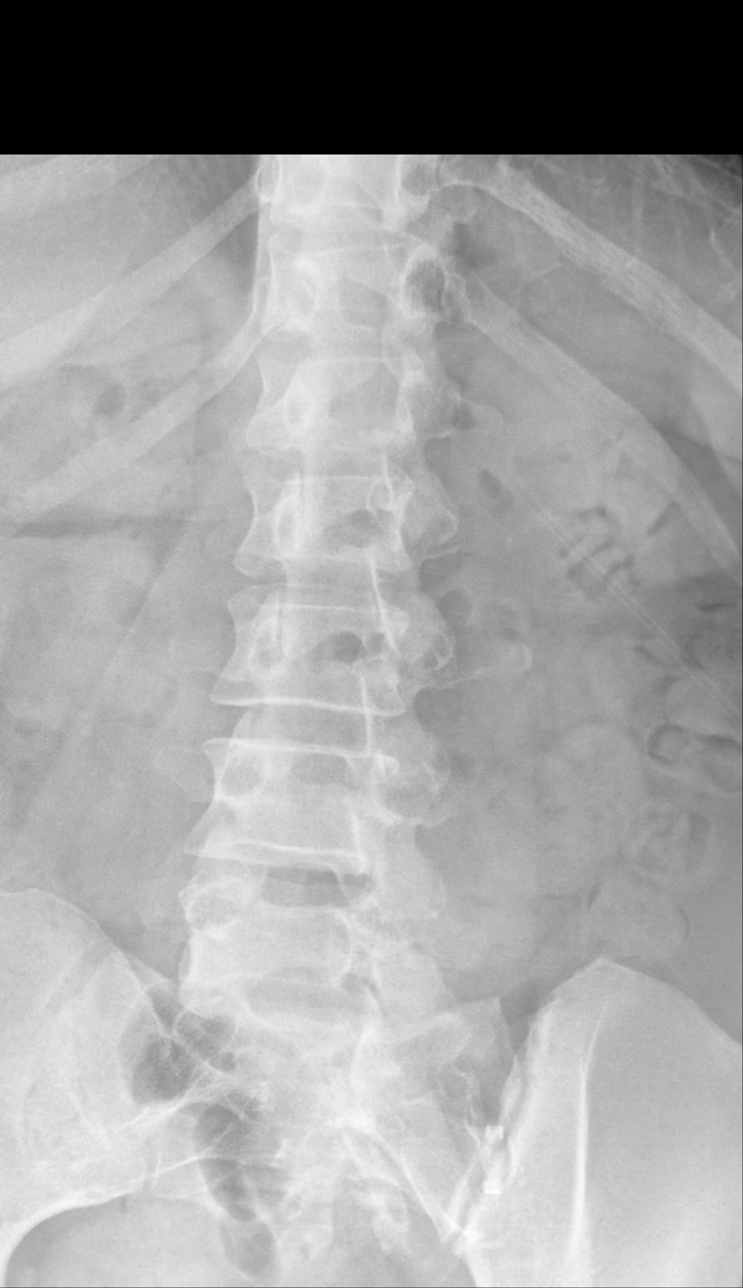

[lumbar spine mlo (2 of 2)]
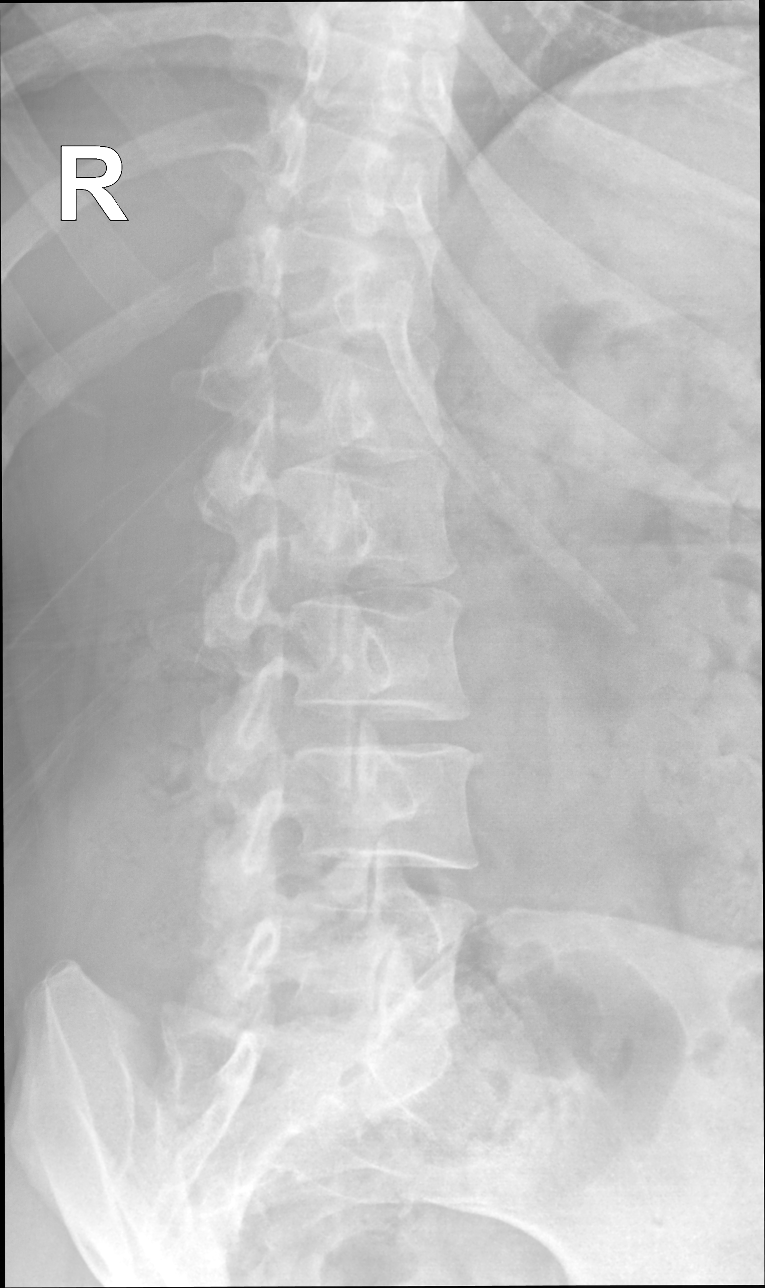

[lumbar spine lat (1 of 2)]
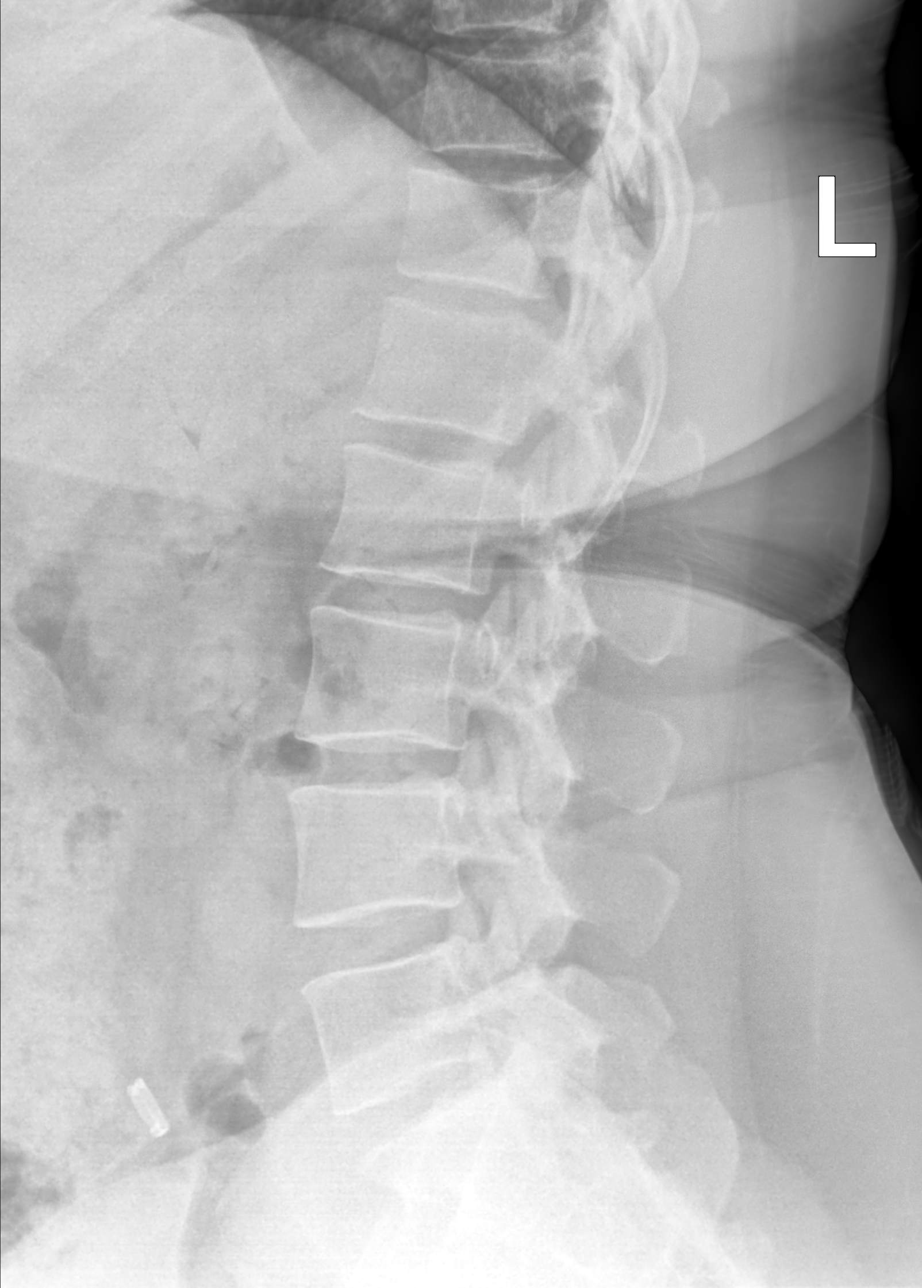

[lumbar spine lat (2 of 2)]
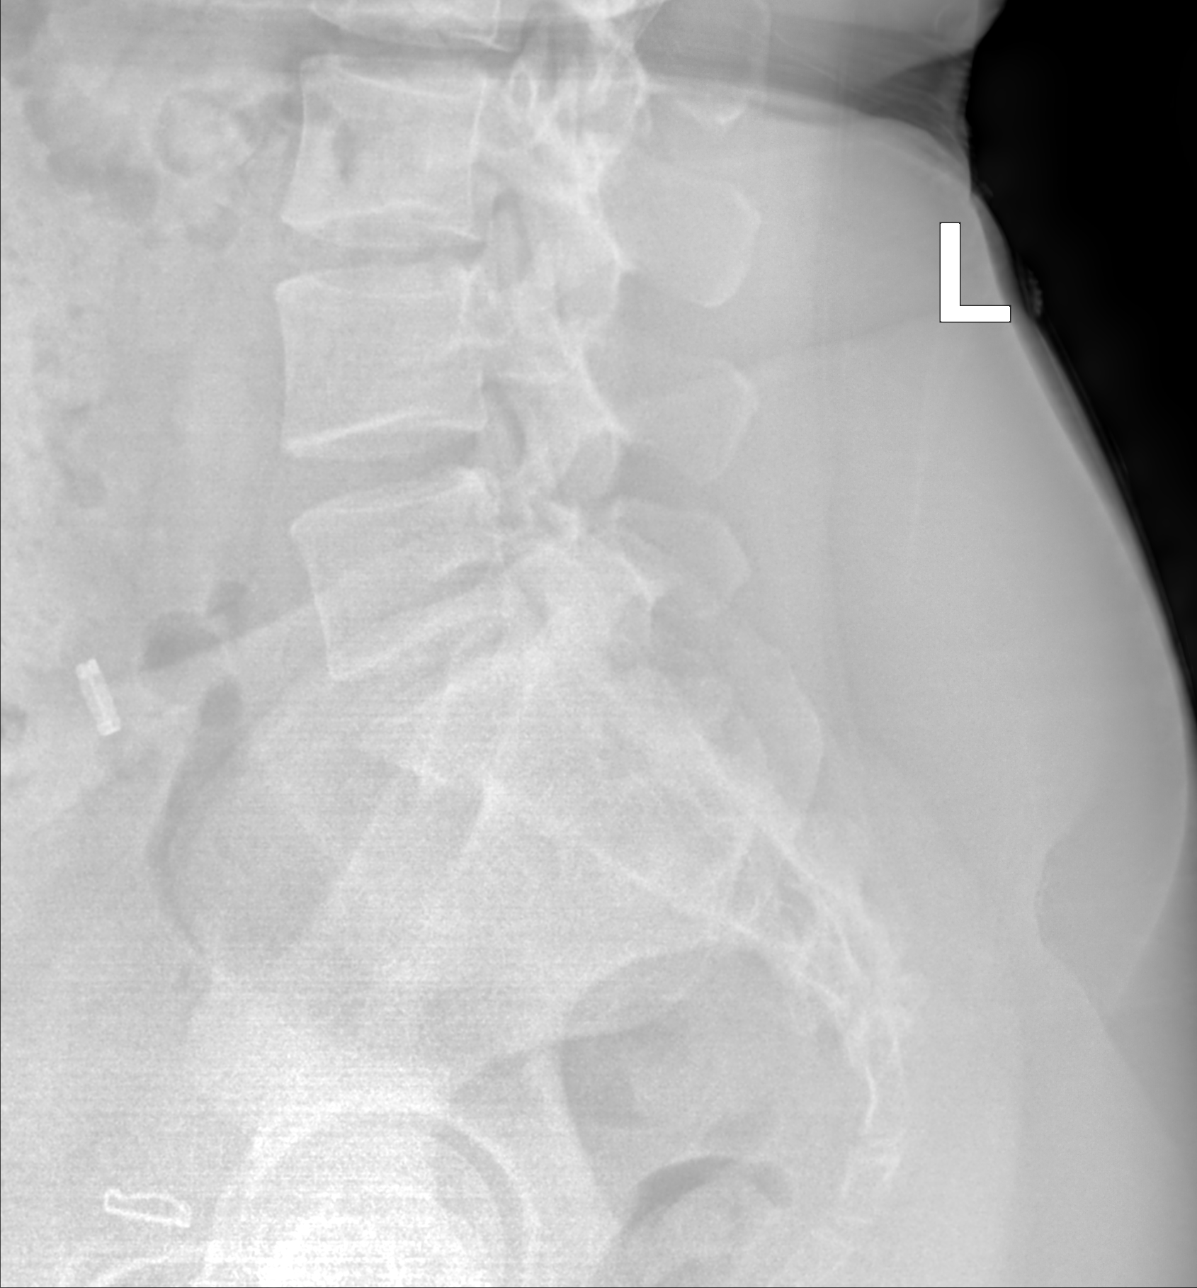

[5 of 5 positions shown; findings below may reference images not displayed]

FINDINGS: There are 5 lumbar type vertebra. The alignment is maintained.
Vertebral body heights are normal. There is no listhesis. The
posterior elements are intact. Disc spaces are preserved. No
fracture, pars defects, or focal lesion. Sacroiliac joints are
symmetric and normal. Tubal ligation clips in the pelvis.
IMPRESSION: Negative radiographs of the lumbar spine.

## 2023-12-30 ENCOUNTER — Encounter (INDEPENDENT_AMBULATORY_CARE_PROVIDER_SITE_OTHER): Payer: Self-pay | Admitting: Gastroenterology

## 2024-04-12 ENCOUNTER — Other Ambulatory Visit (HOSPITAL_COMMUNITY): Payer: Self-pay | Admitting: Internal Medicine

## 2024-04-12 DIAGNOSIS — Z1231 Encounter for screening mammogram for malignant neoplasm of breast: Secondary | ICD-10-CM

## 2024-06-15 ENCOUNTER — Ambulatory Visit (HOSPITAL_COMMUNITY)
